# Patient Record
Sex: Female | Born: 1935 | Race: White | Hispanic: No | Marital: Single | Smoking: Never smoker
Health system: Southern US, Community
[De-identification: ages and names within clinical notes are randomized; demographics above are authoritative.]

## PROBLEM LIST (undated history)

## (undated) DIAGNOSIS — T8859XA Other complications of anesthesia, initial encounter: Secondary | ICD-10-CM

## (undated) DIAGNOSIS — C4491 Basal cell carcinoma of skin, unspecified: Secondary | ICD-10-CM

## (undated) DIAGNOSIS — C801 Malignant (primary) neoplasm, unspecified: Secondary | ICD-10-CM

## (undated) DIAGNOSIS — T4145XA Adverse effect of unspecified anesthetic, initial encounter: Secondary | ICD-10-CM

## (undated) DIAGNOSIS — R112 Nausea with vomiting, unspecified: Secondary | ICD-10-CM

## (undated) DIAGNOSIS — Z9889 Other specified postprocedural states: Secondary | ICD-10-CM

## (undated) DIAGNOSIS — I4891 Unspecified atrial fibrillation: Secondary | ICD-10-CM

## (undated) HISTORY — DX: Unspecified atrial fibrillation: I48.91

## (undated) HISTORY — PX: KNEE SURGERY: SHX244

## (undated) HISTORY — PX: ABDOMINAL HYSTERECTOMY: SHX81

## (undated) HISTORY — DX: Basal cell carcinoma of skin, unspecified: C44.91

---
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
## 1962-12-05 HISTORY — PX: DILATION AND CURETTAGE OF UTERUS: SHX78

## 1980-12-05 DIAGNOSIS — C801 Malignant (primary) neoplasm, unspecified: Secondary | ICD-10-CM

## 1980-12-05 HISTORY — DX: Malignant (primary) neoplasm, unspecified: C80.1

## 1980-12-05 HISTORY — PX: MASTECTOMY: SHX3

## 2012-08-16 ENCOUNTER — Ambulatory Visit: Payer: Self-pay | Admitting: Unknown Physician Specialty

## 2017-10-03 ENCOUNTER — Other Ambulatory Visit: Payer: Self-pay | Admitting: Infectious Diseases

## 2017-10-03 DIAGNOSIS — R1013 Epigastric pain: Secondary | ICD-10-CM

## 2017-10-18 ENCOUNTER — Ambulatory Visit
Admission: RE | Admit: 2017-10-18 | Discharge: 2017-10-18 | Disposition: A | Discharge status: Home / Self Care | Payer: Medicare Other | Source: Ambulatory Visit | Attending: Infectious Diseases | Admitting: Infectious Diseases

## 2017-10-18 DIAGNOSIS — I700 Atherosclerosis of aorta: Secondary | ICD-10-CM | POA: Insufficient documentation

## 2017-10-18 DIAGNOSIS — R1013 Epigastric pain: Secondary | ICD-10-CM | POA: Diagnosis present

## 2017-10-18 DIAGNOSIS — N130 Hydronephrosis with ureteropelvic junction obstruction: Secondary | ICD-10-CM | POA: Diagnosis not present

## 2017-10-18 DIAGNOSIS — K7689 Other specified diseases of liver: Secondary | ICD-10-CM | POA: Insufficient documentation

## 2017-10-18 HISTORY — DX: Malignant (primary) neoplasm, unspecified: C80.1

## 2017-10-18 MED ORDER — IOPAMIDOL (ISOVUE-300) INJECTION 61%
100.0000 mL | Freq: Once | INTRAVENOUS | Status: AC | PRN
  Administered 2017-10-18: 100 mL via INTRAVENOUS

## 2017-10-20 ENCOUNTER — Encounter: Payer: Self-pay | Admitting: Urology

## 2017-10-20 ENCOUNTER — Ambulatory Visit (INDEPENDENT_AMBULATORY_CARE_PROVIDER_SITE_OTHER): Payer: Medicare Other | Admitting: Urology

## 2017-10-20 VITALS — BP 121/80 | HR 83 | Ht 62.0 in | Wt 120.9 lb

## 2017-10-20 DIAGNOSIS — N1330 Unspecified hydronephrosis: Secondary | ICD-10-CM | POA: Diagnosis not present

## 2017-10-20 DIAGNOSIS — N2889 Other specified disorders of kidney and ureter: Secondary | ICD-10-CM

## 2017-10-20 NOTE — Progress Notes (Signed)
10/20/2017 12:29 PM   Aretta Nip [birth year] [id number]  Referring provider: Leonel Ramsay, MD [place], [place] [address]  Chief Complaint  Patient presents with  . Hydronephrosis    HPI: The patient is a 83 year old female who presents today for evaluation of bilateral hydronephrosis to the proximal ureter as well as circumferential wall thickening and hyperenhancement of the proximal left ureter.  This was found on a CT scan performed for epigastric pain.  Her only complaint is nocturia x1.  She has no other urologic complaints.  No history of gross hematuria.  No history of recurrent UTI.  No history of nephrolithiasis.  No known history of hydronephrosis.    PMH: Past Medical History:  Diagnosis Date  . Atrial fibrillation ([place])   . Cancer [facility]) 1982   Breast    Surgical History: Past Surgical History:  Procedure Laterality Date  . ABDOMINAL HYSTERECTOMY    . MASTECTOMY      Home Medications:  Allergies as of 10/20/2017      Reactions   Nsaids    Asa [aspirin]    Sulfa Antibiotics Rash      Medication List        Accurate as of 10/20/17 12:29 PM. Always use your most recent med list.          Calcium Carbonate-Vitamin D 600-400 MG-UNIT tablet Take by mouth.   multivitamin tablet Take 1 tablet daily by mouth.   omeprazole 40 MG capsule Commonly known as:  PRILOSEC Take by mouth.   pyridOXINE 25 MG tablet Commonly known as:  VITAMIN B-6 Take by mouth.       Allergies:  Allergies  Allergen Reactions  . Nsaids   . Asa [Aspirin]   . Sulfa Antibiotics Rash    Family History: Family History  Problem Relation Age of Onset  . Bladder Cancer Neg Hx   . Kidney cancer Neg Hx     Social History:  reports that  has never smoked. she has never used smokeless tobacco. She reports that she does not drink alcohol or use drugs.  ROS: UROLOGY Frequent Urination?: No Hard to postpone urination?: No Burning/pain with  urination?: No Get up at night to urinate?: Yes Leakage of urine?: No Urine stream starts and stops?: No Trouble starting stream?: No Do you have to strain to urinate?: No Blood in urine?: No Urinary tract infection?: No Sexually transmitted disease?: No Injury to kidneys or bladder?: No Painful intercourse?: No Weak stream?: No Currently pregnant?: No Vaginal bleeding?: No Last menstrual period?: n  Gastrointestinal Nausea?: No Vomiting?: No Indigestion/heartburn?: No Diarrhea?: No Constipation?: No  Constitutional Fever: No Night sweats?: No Weight loss?: No Fatigue?: No  Skin Skin rash/lesions?: No Itching?: No  Eyes Blurred vision?: No Double vision?: No  Ears/Nose/Throat Sore throat?: No Sinus problems?: No  Hematologic/Lymphatic Swollen glands?: No Easy bruising?: No  Cardiovascular Leg swelling?: No Chest pain?: No  Respiratory Cough?: No Shortness of breath?: No  Endocrine Excessive thirst?: No  Musculoskeletal Back pain?: No Joint pain?: No  Neurological Headaches?: No Dizziness?: No  Psychologic Depression?: No Anxiety?: No  Physical Exam: BP 121/80 (BP Location: Right Arm, Patient Position: Sitting, Cuff Size: Normal)   Pulse 83   Ht 5\' 2"  (1.575 m)   Wt 120 lb 14.4 oz (54.8 kg)   BMI 22.11 kg/m   Constitutional:  Alert and oriented, No acute distress. HEENT: [place] AT, moist mucus membranes.  Trachea midline, no masses. Cardiovascular: No  clubbing, cyanosis, or edema. Respiratory: Normal respiratory effort, no increased work of breathing. GI: Abdomen is soft, nontender, nondistended, no abdominal masses GU: No CVA tenderness.  Skin: No rashes, bruises or suspicious lesions. Lymph: No cervical or inguinal adenopathy. Neurologic: Grossly intact, no focal deficits, moving all 4 extremities. Psychiatric: Normal mood and affect.  Laboratory Data: No results found for: WBC, HGB, HCT, MCV, PLT  No results found for:  CREATININE  No results found for: PSA  No results found for: TESTOSTERONE  No results found for: HGBA1C  Urinalysis No results found for: COLORURINE, APPEARANCEUR, LABSPEC, PHURINE, GLUCOSEU, HGBUR, BILIRUBINUR, KETONESUR, PROTEINUR, UROBILINOGEN, NITRITE, LEUKOCYTESUR  Pertinent Imaging: CLINICAL DATA:  Upper abdominal pain for 1 month.  EXAM: CT ABDOMEN AND PELVIS WITH CONTRAST  TECHNIQUE: Multidetector CT imaging of the abdomen and pelvis was performed using the standard protocol following bolus administration of intravenous contrast.  CONTRAST:  194mL ISOVUE-300 IOPAMIDOL (ISOVUE-300) INJECTION 61%  COMPARISON:  None.  FINDINGS: Lower chest:  Right middle lobe atelectasis.  Hepatobiliary: 6 mm hypoattenuating lesion posterior right liver is too small to characterize but likely benign and probably a cyst. There is no evidence for gallstones, gallbladder wall thickening, or pericholecystic fluid. No intrahepatic or extrahepatic biliary dilation.  Pancreas: No focal mass lesion. No dilatation of the main duct. No intraparenchymal cyst. No peripancreatic edema.  Spleen: No splenomegaly. No focal mass lesion.  Adrenals/Urinary Tract: No adrenal nodule or mass.  There is mild to moderate bilateral hydronephrosis. On the left, this is associated with circumferential wall enhancement in the ureter just proximal to an abrupt transition in the proximal ureter, just distal to the UPJ (image 38 series 2 and coronal image 34 series 5).  Similar degree of hydronephrosis noted in the right kidney with dilation of the right ureter to location at or just below the UPJ. No ureteral wall thickening or hyper enhancement is identified on the right.  Distal ureters are decompressed.  Bladder is unremarkable.  Stomach/Bowel: Wall thickening at the esophagogastric junction may be related to a hiatal hernia. Stomach otherwise unremarkable. Duodenum is normally  positioned as is the ligament of Treitz. No small bowel wall thickening. No small bowel dilatation. The terminal ileum is normal. The appendix is not visualized, but there is no edema or inflammation in the region of the cecum. No gross colonic mass. No colonic wall thickening. No substantial diverticular change.  Vascular/Lymphatic: There is abdominal aortic atherosclerosis without aneurysm. There is no gastrohepatic or hepatoduodenal ligament lymphadenopathy. No intraperitoneal or retroperitoneal lymphadenopathy. No pelvic sidewall lymphadenopathy.  Reproductive: Uterus surgically absent.  There is no adnexal mass.  Other: No intraperitoneal free fluid.  Musculoskeletal: Bone windows reveal no worrisome lytic or sclerotic osseous lesions.  IMPRESSION: 1. Bilateral mild to moderate hydronephrosis with obstruction on each side at the proximal ureter, just distal to the UPJ. On the left there is some mild circumferential wall thickening and hyperenhancement in the ureter just proximal to the abrupt transition. No gross obstructing mass lesion is seen on either side. There is no lymphadenopathy. No retroperitoneal fibrosis. Given changes in the proximal left ureter, urological consultation suggested. 2. 6 mm hypoattenuating right liver lesion, too small to characterize but likely benign. 3.  Aortic Atherosclerois (ICD10-170.0) These results will be called to the ordering clinician or representative by the Radiologist Assistant, and communication documented in the PACS or zVision Dashboard.   Assessment & Plan:    1.  Circumferential thickening with hyperenhancement of the proximal left ureter 2.  Bilateral hydronephrosis to the level of proximal ureter I discussed with the patient that she has an area of concern in her left proximal ureter that would benefit from endoscopic evaluation via ureteroscopy.  I also discussed that she has bilateral hydronephrosis to the proximal  ureter bilaterally.  It is unclear the etiology of this.  I think she would benefit as well from bilateral retrograde pyelogram with possible right ureteroscopy.  I recommended to the patient undergo cystoscopy, bilateral retrograde pyelogram, left ureteroscopy, possible right ureteroscopy, possible ureteral biopsy, possible laser ablation of tumor, and possible bilateral ureteral stent placement.  We discussed the risk, benefits, and indications of this procedure.  She understands the risks include bleeding, infection, iatrogenic injury, need for repeat procedures, and need for ureteral stents.  All questions were answered.  She however would like a second opinion before deciding to undergo surgery.  She is requesting to see my partner, Dr. Erlene Quan, for this.  We will arrange for her to see Dr. Erlene Quan in the near future.   Nickie Retort, MD  [facility] [address], [place], [place] [address] [phone]

## 2017-10-22 LAB — URINE CULTURE

## 2017-10-24 ENCOUNTER — Telehealth: Payer: Self-pay | Admitting: Radiology

## 2017-10-24 ENCOUNTER — Other Ambulatory Visit: Payer: Self-pay | Admitting: Radiology

## 2017-10-24 ENCOUNTER — Ambulatory Visit (INDEPENDENT_AMBULATORY_CARE_PROVIDER_SITE_OTHER): Payer: Medicare Other | Admitting: Urology

## 2017-10-24 ENCOUNTER — Encounter: Payer: Self-pay | Admitting: Urology

## 2017-10-24 VITALS — BP 127/73 | HR 101 | Ht 62.0 in | Wt 120.0 lb

## 2017-10-24 DIAGNOSIS — N2889 Other specified disorders of kidney and ureter: Secondary | ICD-10-CM

## 2017-10-24 DIAGNOSIS — N1330 Unspecified hydronephrosis: Secondary | ICD-10-CM

## 2017-10-24 NOTE — H&P (View-Only) (Signed)
10/24/2017 2:17 PM   Tracy Grimes 12-29-1931 [id number]  Referring provider: Leonel Ramsay, MD [place], [place] [address]  Chief Complaint  Patient presents with  . Hydronephrosis    second opinion    HPI: 83 year old female referred for further evaluation of incidental bilateral hydronephrosis.  CT scan of the abdomen pelvis was obtained as part of workup for an episode of mid epigastric pain, weakness and fatigue.  CT scan abdomen and pelvis with contrast on 10/18/2017 showed mild to moderate hydronephrosis bilaterally with a distended proximal ureter with an abrupt transition point beyond the level of the UPJ of unclear etiology.  Additionally, there is hyperenhancement of the left proximal ureter.  No other GU pathology was identified.  Cr 0.6 on 10/04/27.  She denies any flank pain or history of renal pathology.  No urinary symptoms other than occasional urinary frequency and urgency which has been worsening over the past several years.  No incontinence.  No urinary tract infections or gross hematuria.  Overall, she is quite healthy.  She is never had any previous cross-sectional imaging.  She was previously seen and evaluated in our office last week by Dr. Pilar Jarvis.  She was recommended to undergo further evaluation with bilateral retrograde, diagnostic ureteroscopy.   PMH: Past Medical History:  Diagnosis Date  . Atrial fibrillation ([place])   . Cancer [facility]) 1982   Breast    Surgical History: Past Surgical History:  Procedure Laterality Date  . ABDOMINAL HYSTERECTOMY    . MASTECTOMY      Home Medications:  Allergies as of 10/24/2017      Reactions   Asa [aspirin] Anaphylaxis   Nsaids Anaphylaxis   Sulfa Antibiotics Rash      Medication List        Accurate as of 10/24/17 11:59 PM. Always use your most recent med list.          acetaminophen 500 MG tablet Commonly known as:  TYLENOL Take 500 mg by mouth every 6 (six) hours  as needed for moderate pain or headache.   Biotin 1000 MCG tablet Take 1,000 mcg by mouth daily.   Calcium Carbonate-Vitamin D 600-400 MG-UNIT tablet Take 2 tablets by mouth daily.   multivitamin tablet Take 1 tablet daily by mouth.   omeprazole 40 MG capsule Commonly known as:  PRILOSEC Take 40 mg by mouth daily.   PRESERVISION AREDS 2 PO Take 1 capsule by mouth 2 (two) times daily.   pyridOXINE 100 MG tablet Commonly known as:  VITAMIN B-6 Take 100 mg by mouth daily.       Allergies:  Allergies  Allergen Reactions  . Asa [Aspirin] Anaphylaxis  . Nsaids Anaphylaxis  . Sulfa Antibiotics Rash    Family History: Family History  Problem Relation Age of Onset  . Bladder Cancer Neg Hx   . Kidney cancer Neg Hx     Social History:  reports that  has never smoked. she has never used smokeless tobacco. She reports that she does not drink alcohol or use drugs.  ROS: UROLOGY Frequent Urination?: No Hard to postpone urination?: No Burning/pain with urination?: No Get up at night to urinate?: No Leakage of urine?: No Urine stream starts and stops?: No Trouble starting stream?: No Do you have to strain to urinate?: No Blood in urine?: No Urinary tract infection?: No Sexually transmitted disease?: No Injury to kidneys or bladder?: No Painful intercourse?: No Weak stream?: No Currently pregnant?: No Vaginal bleeding?: No Last  menstrual period?: n  Gastrointestinal Nausea?: No Vomiting?: No Indigestion/heartburn?: No Diarrhea?: No Constipation?: No  Constitutional Fever: No Night sweats?: No Weight loss?: No Fatigue?: Yes  Skin Skin rash/lesions?: No Itching?: No  Eyes Blurred vision?: No Double vision?: No  Ears/Nose/Throat Sore throat?: No Sinus problems?: No  Hematologic/Lymphatic Swollen glands?: No Easy bruising?: No  Cardiovascular Leg swelling?: No Chest pain?: No  Respiratory Cough?: No Shortness of breath?:  No  Endocrine Excessive thirst?: No  Musculoskeletal Back pain?: No Joint pain?: No  Neurological Headaches?: No Dizziness?: No  Psychologic Depression?: No Anxiety?: No  Physical Exam: BP 127/73   Pulse (!) 101   Ht 5\' 2"  (1.575 m)   Wt 120 lb (54.4 kg)   BMI 21.95 kg/m   Constitutional:  Alert and oriented, No acute distress. HEENT: [place] AT, moist mucus membranes.  Trachea midline, no masses. Cardiovascular: No clubbing, cyanosis, or edema. Respiratory: Normal respiratory effort, no increased work of breathing. GI: Abdomen is soft, nontender, nondistended, no abdominal masses GU: No CVA tenderness.  Skin: No rashes, bruises or suspicious lesions. Lymph: No cervical or inguinal adenopathy. Neurologic: Grossly intact, no focal deficits, moving all 4 extremities. Psychiatric: Normal mood and affect.  Laboratory Data: Cr 0.6 on 09/3017.    Urinalysis UA from 10/19/2017 reviewed, negative for blood or infection.  Pertinent Imaging: CLINICAL DATA:  Upper abdominal pain for 1 month.  EXAM: CT ABDOMEN AND PELVIS WITH CONTRAST  TECHNIQUE: Multidetector CT imaging of the abdomen and pelvis was performed using the standard protocol following bolus administration of intravenous contrast.  CONTRAST:  182mL ISOVUE-300 IOPAMIDOL (ISOVUE-300) INJECTION 61%  COMPARISON:  None.  FINDINGS: Lower chest:  Right middle lobe atelectasis.  Hepatobiliary: 6 mm hypoattenuating lesion posterior right liver is too small to characterize but likely benign and probably a cyst. There is no evidence for gallstones, gallbladder wall thickening, or pericholecystic fluid. No intrahepatic or extrahepatic biliary dilation.  Pancreas: No focal mass lesion. No dilatation of the main duct. No intraparenchymal cyst. No peripancreatic edema.  Spleen: No splenomegaly. No focal mass lesion.  Adrenals/Urinary Tract: No adrenal nodule or mass.  There is mild to moderate bilateral  hydronephrosis. On the left, this is associated with circumferential wall enhancement in the ureter just proximal to an abrupt transition in the proximal ureter, just distal to the UPJ (image 38 series 2 and coronal image 34 series 5).  Similar degree of hydronephrosis noted in the right kidney with dilation of the right ureter to location at or just below the UPJ. No ureteral wall thickening or hyper enhancement is identified on the right.  Distal ureters are decompressed.  Bladder is unremarkable.  Stomach/Bowel: Wall thickening at the esophagogastric junction may be related to a hiatal hernia. Stomach otherwise unremarkable. Duodenum is normally positioned as is the ligament of Treitz. No small bowel wall thickening. No small bowel dilatation. The terminal ileum is normal. The appendix is not visualized, but there is no edema or inflammation in the region of the cecum. No gross colonic mass. No colonic wall thickening. No substantial diverticular change.  Vascular/Lymphatic: There is abdominal aortic atherosclerosis without aneurysm. There is no gastrohepatic or hepatoduodenal ligament lymphadenopathy. No intraperitoneal or retroperitoneal lymphadenopathy. No pelvic sidewall lymphadenopathy.  Reproductive: Uterus surgically absent.  There is no adnexal mass.  Other: No intraperitoneal free fluid.  Musculoskeletal: Bone windows reveal no worrisome lytic or sclerotic osseous lesions.  IMPRESSION: 1. Bilateral mild to moderate hydronephrosis with obstruction on each side at the proximal ureter,  just distal to the UPJ. On the left there is some mild circumferential wall thickening and hyperenhancement in the ureter just proximal to the abrupt transition. No gross obstructing mass lesion is seen on either side. There is no lymphadenopathy. No retroperitoneal fibrosis. Given changes in the proximal left ureter, urological consultation suggested. 2. 6 mm hypoattenuating  right liver lesion, too small to characterize but likely benign. 3.  Aortic Atherosclerois (ICD10-170.0) These results will be called to the ordering clinician or representative by the Radiologist Assistant, and communication documented in the PACS or zVision Dashboard.   Electronically Signed   By: Misty Stanley M.D.   On: 10/18/2017 12:46  CT scan was personally reviewed today and with the patient.  Assessment & Plan:    1. Hydronephrosis, unspecified hydronephrosis type CT scan imaging reviewed-incidental bilateral hydroureteronephrosis with abrupt transition bilaterally which does not appear to be consistent with UPJ Given bilateral nature, may have congenital component vs. Infectious/inflammatory vs. Least likely malignancy  Creatinine remains at baseline No previous imaging for comparison I  Have strongly recommended further evaluation in the form of bilateral retrograde pyelogram, possible diagnostic ureteroscopy, possible biopsy, possible stent in order to rule out any pathology   Risk and benefits were discussed in detail.  All questions were answered.  She is agreeable with this plan and will be booked for surgery in early December.  2. Ureteritis Hyperenhancement of left proximal ureter, question infectious/ inflammatory component Otherwise asymptomatic without flank pain, or UTI  [name], MD  [place] [facility] [address], [place], [place] [address] [phone]   I spent 25 min with this patient of which greater than 50% was spent in counseling and coordination of care with the patient.

## 2017-10-24 NOTE — Progress Notes (Signed)
10/24/2017 2:17 PM   Tracy Grimes Apr 27, 1932 [id number]  Referring provider: Leonel Ramsay, MD [place], [place] [address]  Chief Complaint  Patient presents with  . Hydronephrosis    second opinion    HPI: 83 year old female referred for further evaluation of incidental bilateral hydronephrosis.  CT scan of the abdomen pelvis was obtained as part of workup for an episode of mid epigastric pain, weakness and fatigue.  CT scan abdomen and pelvis with contrast on 10/18/2017 showed mild to moderate hydronephrosis bilaterally with a distended proximal ureter with an abrupt transition point beyond the level of the UPJ of unclear etiology.  Additionally, there is hyperenhancement of the left proximal ureter.  No other GU pathology was identified.  Cr 0.6 on 10/04/27.  She denies any flank pain or history of renal pathology.  No urinary symptoms other than occasional urinary frequency and urgency which has been worsening over the past several years.  No incontinence.  No urinary tract infections or gross hematuria.  Overall, she is quite healthy.  She is never had any previous cross-sectional imaging.  She was previously seen and evaluated in our office last week by Dr. Pilar Jarvis.  She was recommended to undergo further evaluation with bilateral retrograde, diagnostic ureteroscopy.   PMH: Past Medical History:  Diagnosis Date  . Atrial fibrillation ([place])   . Cancer [facility]) 1982   Breast    Surgical History: Past Surgical History:  Procedure Laterality Date  . ABDOMINAL HYSTERECTOMY    . MASTECTOMY      Home Medications:  Allergies as of 10/24/2017      Reactions   Asa [aspirin] Anaphylaxis   Nsaids Anaphylaxis   Sulfa Antibiotics Rash      Medication List        Accurate as of 10/24/17 11:59 PM. Always use your most recent med list.          acetaminophen 500 MG tablet Commonly known as:  TYLENOL Take 500 mg by mouth every 6 (six) hours  as needed for moderate pain or headache.   Biotin 1000 MCG tablet Take 1,000 mcg by mouth daily.   Calcium Carbonate-Vitamin D 600-400 MG-UNIT tablet Take 2 tablets by mouth daily.   multivitamin tablet Take 1 tablet daily by mouth.   omeprazole 40 MG capsule Commonly known as:  PRILOSEC Take 40 mg by mouth daily.   PRESERVISION AREDS 2 PO Take 1 capsule by mouth 2 (two) times daily.   pyridOXINE 100 MG tablet Commonly known as:  VITAMIN B-6 Take 100 mg by mouth daily.       Allergies:  Allergies  Allergen Reactions  . Asa [Aspirin] Anaphylaxis  . Nsaids Anaphylaxis  . Sulfa Antibiotics Rash    Family History: Family History  Problem Relation Age of Onset  . Bladder Cancer Neg Hx   . Kidney cancer Neg Hx     Social History:  reports that  has never smoked. she has never used smokeless tobacco. She reports that she does not drink alcohol or use drugs.  ROS: UROLOGY Frequent Urination?: No Hard to postpone urination?: No Burning/pain with urination?: No Get up at night to urinate?: No Leakage of urine?: No Urine stream starts and stops?: No Trouble starting stream?: No Do you have to strain to urinate?: No Blood in urine?: No Urinary tract infection?: No Sexually transmitted disease?: No Injury to kidneys or bladder?: No Painful intercourse?: No Weak stream?: No Currently pregnant?: No Vaginal bleeding?: No Last  menstrual period?: n  Gastrointestinal Nausea?: No Vomiting?: No Indigestion/heartburn?: No Diarrhea?: No Constipation?: No  Constitutional Fever: No Night sweats?: No Weight loss?: No Fatigue?: Yes  Skin Skin rash/lesions?: No Itching?: No  Eyes Blurred vision?: No Double vision?: No  Ears/Nose/Throat Sore throat?: No Sinus problems?: No  Hematologic/Lymphatic Swollen glands?: No Easy bruising?: No  Cardiovascular Leg swelling?: No Chest pain?: No  Respiratory Cough?: No Shortness of breath?:  No  Endocrine Excessive thirst?: No  Musculoskeletal Back pain?: No Joint pain?: No  Neurological Headaches?: No Dizziness?: No  Psychologic Depression?: No Anxiety?: No  Physical Exam: BP 127/73   Pulse (!) 101   Ht 5\' 2"  (1.575 m)   Wt 120 lb (54.4 kg)   BMI 21.95 kg/m   Constitutional:  Alert and oriented, No acute distress. HEENT: [place] AT, moist mucus membranes.  Trachea midline, no masses. Cardiovascular: No clubbing, cyanosis, or edema. Respiratory: Normal respiratory effort, no increased work of breathing. GI: Abdomen is soft, nontender, nondistended, no abdominal masses GU: No CVA tenderness.  Skin: No rashes, bruises or suspicious lesions. Lymph: No cervical or inguinal adenopathy. Neurologic: Grossly intact, no focal deficits, moving all 4 extremities. Psychiatric: Normal mood and affect.  Laboratory Data: Cr 0.6 on 09/3017.    Urinalysis UA from 10/19/2017 reviewed, negative for blood or infection.  Pertinent Imaging: CLINICAL DATA:  Upper abdominal pain for 1 month.  EXAM: CT ABDOMEN AND PELVIS WITH CONTRAST  TECHNIQUE: Multidetector CT imaging of the abdomen and pelvis was performed using the standard protocol following bolus administration of intravenous contrast.  CONTRAST:  143mL ISOVUE-300 IOPAMIDOL (ISOVUE-300) INJECTION 61%  COMPARISON:  None.  FINDINGS: Lower chest:  Right middle lobe atelectasis.  Hepatobiliary: 6 mm hypoattenuating lesion posterior right liver is too small to characterize but likely benign and probably a cyst. There is no evidence for gallstones, gallbladder wall thickening, or pericholecystic fluid. No intrahepatic or extrahepatic biliary dilation.  Pancreas: No focal mass lesion. No dilatation of the main duct. No intraparenchymal cyst. No peripancreatic edema.  Spleen: No splenomegaly. No focal mass lesion.  Adrenals/Urinary Tract: No adrenal nodule or mass.  There is mild to moderate bilateral  hydronephrosis. On the left, this is associated with circumferential wall enhancement in the ureter just proximal to an abrupt transition in the proximal ureter, just distal to the UPJ (image 38 series 2 and coronal image 34 series 5).  Similar degree of hydronephrosis noted in the right kidney with dilation of the right ureter to location at or just below the UPJ. No ureteral wall thickening or hyper enhancement is identified on the right.  Distal ureters are decompressed.  Bladder is unremarkable.  Stomach/Bowel: Wall thickening at the esophagogastric junction may be related to a hiatal hernia. Stomach otherwise unremarkable. Duodenum is normally positioned as is the ligament of Treitz. No small bowel wall thickening. No small bowel dilatation. The terminal ileum is normal. The appendix is not visualized, but there is no edema or inflammation in the region of the cecum. No gross colonic mass. No colonic wall thickening. No substantial diverticular change.  Vascular/Lymphatic: There is abdominal aortic atherosclerosis without aneurysm. There is no gastrohepatic or hepatoduodenal ligament lymphadenopathy. No intraperitoneal or retroperitoneal lymphadenopathy. No pelvic sidewall lymphadenopathy.  Reproductive: Uterus surgically absent.  There is no adnexal mass.  Other: No intraperitoneal free fluid.  Musculoskeletal: Bone windows reveal no worrisome lytic or sclerotic osseous lesions.  IMPRESSION: 1. Bilateral mild to moderate hydronephrosis with obstruction on each side at the proximal ureter,  just distal to the UPJ. On the left there is some mild circumferential wall thickening and hyperenhancement in the ureter just proximal to the abrupt transition. No gross obstructing mass lesion is seen on either side. There is no lymphadenopathy. No retroperitoneal fibrosis. Given changes in the proximal left ureter, urological consultation suggested. 2. 6 mm hypoattenuating  right liver lesion, too small to characterize but likely benign. 3.  Aortic Atherosclerois (ICD10-170.0) These results will be called to the ordering clinician or representative by the Radiologist Assistant, and communication documented in the PACS or zVision Dashboard.   Electronically Signed   By: Misty Stanley M.D.   On: 10/18/2017 12:46  CT scan was personally reviewed today and with the patient.  Assessment & Plan:    1. Hydronephrosis, unspecified hydronephrosis type CT scan imaging reviewed-incidental bilateral hydroureteronephrosis with abrupt transition bilaterally which does not appear to be consistent with UPJ Given bilateral nature, may have congenital component vs. Infectious/inflammatory vs. Least likely malignancy  Creatinine remains at baseline No previous imaging for comparison I  Have strongly recommended further evaluation in the form of bilateral retrograde pyelogram, possible diagnostic ureteroscopy, possible biopsy, possible stent in order to rule out any pathology   Risk and benefits were discussed in detail.  All questions were answered.  She is agreeable with this plan and will be booked for surgery in early December.  2. Ureteritis Hyperenhancement of left proximal ureter, question infectious/ inflammatory component Otherwise asymptomatic without flank pain, or UTI  [name], MD  [facility] [address], [place], [place] [address] [phone]   I spent 25 min with this patient of which greater than 50% was spent in counseling and coordination of care with the patient.

## 2017-10-24 NOTE — Telephone Encounter (Signed)
Made pt aware of surgery scheduled 11/08/17 with Dr Erlene Quan & pre-admit testing appt on 11/02/17 @8 :00. Instructions given. Questions answered. Pt voices understanding & appreciation of call.

## 2017-10-25 ENCOUNTER — Other Ambulatory Visit: Payer: Self-pay | Admitting: Radiology

## 2017-11-02 ENCOUNTER — Other Ambulatory Visit: Payer: Self-pay

## 2017-11-02 ENCOUNTER — Encounter
Admission: RE | Admit: 2017-11-02 | Discharge: 2017-11-02 | Disposition: A | Discharge status: Home / Self Care | Payer: Medicare Other | Source: Ambulatory Visit | Attending: Urology | Admitting: Urology

## 2017-11-02 HISTORY — DX: Other complications of anesthesia, initial encounter: T88.59XA

## 2017-11-02 HISTORY — DX: Adverse effect of unspecified anesthetic, initial encounter: T41.45XA

## 2017-11-02 HISTORY — DX: Other specified postprocedural states: Z98.890

## 2017-11-02 HISTORY — DX: Nausea with vomiting, unspecified: R11.2

## 2017-11-02 NOTE — Patient Instructions (Signed)
  Your procedure is scheduled on:Wednesday Dec. 5, 2018. Report to Same Day Surgery. To find out your arrival time please call [phone] between 1PM - 3PM on Tuesday Dec. 4, 2018.  Remember: Instructions that are not followed completely may result in serious medical risk, up to and including death, or upon the discretion of your surgeon and anesthesiologist your surgery may need to be rescheduled.    _x___ 1. Do not eat food after midnight night prior to surgery. No gum   chewing or hard candies, snacks or breakfast.    2023-05-20 drink the following: water, Gatorade, clear apple juice, black coffee     or black tea up until 2 hours prior to ARRIVAL time.     ____ 2. No Alcohol for 24 hours before or after surgery.   ____ 3. Bring all medications with you on the day of surgery if instructed.    __x__ 4. Notify your doctor if there is any change in your medical condition     (cold, fever, infections).    _____ 5.   Do Not Smoke or use e-cigarettes For 24 Hours Prior to Your   Surgery.  Do not use any chewable tobacco products for at least 6   hours prior to  surgery.                      Do not wear jewelry, make-up, hairpins, clips or nail polish.  Do not wear lotions, powders, or perfumes.   Do not shave 48 hours prior to surgery. Men may shave face and neck.  Do not bring valuables to the hospital.    [facility] is not responsible for any belongings or valuables.               Contacts, dentures or bridgework may not be worn into surgery.  Leave your suitcase in the car. After surgery it may be brought to your room.  For patients admitted to the hospital, discharge time is determined by your  treatment team.   Patients discharged the day of surgery will not be allowed to drive home.    Please read over the following fact sheets that you were given:   [facility] Preparing for Surgery  __x__ Take these medicines the morning of surgery with A SIP OF WATER:    1. omeprazole  (PRILOSEC) take at bedtime the night prior to surgery and the morning of surgery.       ____ Fleet Enema (as directed)   ____ Use CHG Soap as directed on instruction sheet  ____ Use inhalers on the day of surgery and bring to hospital day of surgery  ____ Stop metformin 2 days prior to surgery    ____ Take 1/2 of usual insulin dose the night before surgery and none on the morning of          surgery.   ____ Stop Eliquis/Coumadin/Plavix/aspirin on does not apply  ____ Stop Anti-inflammatories such as Advil, Aleve, Ibuprofen, Motrin, Naproxen,  Naprosyn, Goodies powders or aspirin products. OK to take Tylenol.   _x___ Stop supplements:Biotin   until after surgery.    _x___ Bring your Living Will and [place] to scan into Epic system.

## 2017-11-02 NOTE — Pre-Procedure Instructions (Signed)
Spoke with Dr. Ronelle Nigh regarding CBC WNL 820/2018, Met B WNL 10/03/17 and medical history of A-Fib and breast cancer.  No need to repeat CBC at this time, use CBC from 07/24/2017.

## 2017-11-02 NOTE — Pre-Procedure Instructions (Addendum)
Cardiac clearance from Dr. Saralyn Pilar in front of pt's chart, low risk.  ECG 12-lead8/20/2018 [place] Component Name Value Ref Range  Vent Rate (bpm) 84   PR Interval (msec) 188   QRS Interval (msec) 88   QT Interval (msec) 366   QTc (msec) 432   Other Result Information  This result has an attachment that is not available.  Result Narrative  Normal sinus rhythm Possible Left atrial enlargement Borderline ECG When compared with ECG of 20-Jan-2016 10:58, premature ventricular complexes are no longer present I reviewed and concur with this report. Electronically signed FG:[id number] MD, DAVID ([id number]) on 08/04/2017 12:00:50 PM

## 2017-11-07 MED ORDER — CEFAZOLIN SODIUM-DEXTROSE 2-4 GM/100ML-% IV SOLN
2.0000 g | INTRAVENOUS | Status: AC
  Administered 2017-11-08: 2 g via INTRAVENOUS

## 2017-11-08 ENCOUNTER — Ambulatory Visit: Payer: Medicare Other | Admitting: Certified Registered Nurse Anesthetist

## 2017-11-08 ENCOUNTER — Encounter
Admission: RE | Disposition: A | Discharge status: Home / Self Care | Payer: Self-pay | Source: Ambulatory Visit | Attending: Urology

## 2017-11-08 ENCOUNTER — Encounter: Payer: Self-pay | Admitting: Certified Registered Nurse Anesthetist

## 2017-11-08 ENCOUNTER — Ambulatory Visit
Admission: RE | Admit: 2017-11-08 | Discharge: 2017-11-08 | Disposition: A | Discharge status: Home / Self Care | Payer: Medicare Other | Source: Ambulatory Visit | Attending: Urology | Admitting: Urology

## 2017-11-08 DIAGNOSIS — N2889 Other specified disorders of kidney and ureter: Secondary | ICD-10-CM | POA: Diagnosis not present

## 2017-11-08 DIAGNOSIS — K219 Gastro-esophageal reflux disease without esophagitis: Secondary | ICD-10-CM | POA: Insufficient documentation

## 2017-11-08 DIAGNOSIS — N1330 Unspecified hydronephrosis: Secondary | ICD-10-CM | POA: Insufficient documentation

## 2017-11-08 DIAGNOSIS — Z9010 Acquired absence of unspecified breast and nipple: Secondary | ICD-10-CM | POA: Diagnosis not present

## 2017-11-08 DIAGNOSIS — I700 Atherosclerosis of aorta: Secondary | ICD-10-CM | POA: Insufficient documentation

## 2017-11-08 DIAGNOSIS — Z79899 Other long term (current) drug therapy: Secondary | ICD-10-CM | POA: Insufficient documentation

## 2017-11-08 DIAGNOSIS — N132 Hydronephrosis with renal and ureteral calculous obstruction: Secondary | ICD-10-CM | POA: Diagnosis not present

## 2017-11-08 HISTORY — PX: CYSTOSCOPY WITH URETEROSCOPY: SHX5123

## 2017-11-08 HISTORY — PX: CYSTOSCOPY W/ RETROGRADES: SHX1426

## 2017-11-08 HISTORY — PX: CYSTOSCOPY WITH STENT PLACEMENT: SHX5790

## 2017-11-08 SURGERY — CYSTOSCOPY WITH URETEROSCOPY
Anesthesia: General | Site: Ureter | Laterality: Bilateral | Wound class: Clean

## 2017-11-08 MED ORDER — IOTHALAMATE MEGLUMINE 43 % IV SOLN
INTRAVENOUS | Status: DC | PRN
  Administered 2017-11-08: 10 mL

## 2017-11-08 MED ORDER — SCOPOLAMINE 1 MG/3DAYS TD PT72
1.0000 | MEDICATED_PATCH | Freq: Once | TRANSDERMAL | Status: DC
  Administered 2017-11-08: 1.5 mg via TRANSDERMAL

## 2017-11-08 MED ORDER — ACETAMINOPHEN 10 MG/ML IV SOLN
INTRAVENOUS | Status: AC
  Filled 2017-11-08: qty 100

## 2017-11-08 MED ORDER — LACTATED RINGERS IV SOLN
INTRAVENOUS | Status: DC
  Administered 2017-11-08: 10:00:00 via INTRAVENOUS

## 2017-11-08 MED ORDER — ACETAMINOPHEN 10 MG/ML IV SOLN
INTRAVENOUS | Status: DC | PRN
  Administered 2017-11-08: 1000 mg via INTRAVENOUS

## 2017-11-08 MED ORDER — PROPOFOL 10 MG/ML IV BOLUS
INTRAVENOUS | Status: DC | PRN
  Administered 2017-11-08: 50 mg via INTRAVENOUS
  Administered 2017-11-08: 150 mg via INTRAVENOUS

## 2017-11-08 MED ORDER — LIDOCAINE HCL (PF) 2 % IJ SOLN
INTRAMUSCULAR | Status: AC
  Filled 2017-11-08: qty 10

## 2017-11-08 MED ORDER — SCOPOLAMINE 1 MG/3DAYS TD PT72
MEDICATED_PATCH | TRANSDERMAL | Status: AC
  Administered 2017-11-08: 1.5 mg via TRANSDERMAL
  Filled 2017-11-08: qty 1

## 2017-11-08 MED ORDER — TAMSULOSIN HCL 0.4 MG PO CAPS: 0.4000 mg | ORAL_CAPSULE | Freq: Every day | ORAL | 0 refills | Status: AC

## 2017-11-08 MED ORDER — OXYBUTYNIN CHLORIDE 5 MG PO TABS: 5.0000 mg | ORAL_TABLET | Freq: Three times a day (TID) | ORAL | 0 refills | Status: AC | PRN

## 2017-11-08 MED ORDER — ONDANSETRON HCL 4 MG/2ML IJ SOLN
INTRAMUSCULAR | Status: AC
Start: 2017-11-08 — End: ?
  Filled 2017-11-08: qty 2

## 2017-11-08 MED ORDER — DEXAMETHASONE SODIUM PHOSPHATE 10 MG/ML IJ SOLN
INTRAMUSCULAR | Status: AC
Start: 2017-11-08 — End: ?
  Filled 2017-11-08: qty 1

## 2017-11-08 MED ORDER — PROPOFOL 500 MG/50ML IV EMUL
INTRAVENOUS | Status: AC
  Filled 2017-11-08: qty 50

## 2017-11-08 MED ORDER — KETOROLAC TROMETHAMINE 30 MG/ML IJ SOLN: INTRAMUSCULAR | Status: DC | PRN

## 2017-11-08 MED ORDER — ROCURONIUM BROMIDE 50 MG/5ML IV SOLN
INTRAVENOUS | Status: AC
  Filled 2017-11-08: qty 1

## 2017-11-08 MED ORDER — GLYCOPYRROLATE 0.2 MG/ML IJ SOLN
INTRAMUSCULAR | Status: AC
  Filled 2017-11-08: qty 1

## 2017-11-08 MED ORDER — PROPOFOL 500 MG/50ML IV EMUL
INTRAVENOUS | Status: DC | PRN
  Administered 2017-11-08: 25 ug/kg/min via INTRAVENOUS

## 2017-11-08 MED ORDER — LIDOCAINE HCL (CARDIAC) 20 MG/ML IV SOLN
INTRAVENOUS | Status: DC | PRN
  Administered 2017-11-08: 100 mg via INTRAVENOUS

## 2017-11-08 MED ORDER — ONDANSETRON HCL 4 MG/2ML IJ SOLN: 4.0000 mg | Freq: Once | INTRAMUSCULAR | Status: DC | PRN

## 2017-11-08 MED ORDER — KETOROLAC TROMETHAMINE 30 MG/ML IJ SOLN
INTRAMUSCULAR | Status: AC
  Filled 2017-11-08: qty 1

## 2017-11-08 MED ORDER — FENTANYL CITRATE (PF) 100 MCG/2ML IJ SOLN
INTRAMUSCULAR | Status: AC
  Filled 2017-11-08: qty 2

## 2017-11-08 MED ORDER — OXYBUTYNIN CHLORIDE 5 MG PO TABS
ORAL_TABLET | ORAL | Status: AC
  Filled 2017-11-08: qty 1

## 2017-11-08 MED ORDER — ONDANSETRON HCL 4 MG/2ML IJ SOLN
INTRAMUSCULAR | Status: DC | PRN
  Administered 2017-11-08: 4 mg via INTRAVENOUS

## 2017-11-08 MED ORDER — HYDROCODONE-ACETAMINOPHEN 5-325 MG PO TABS: 1.0000 | ORAL_TABLET | Freq: Four times a day (QID) | ORAL | 0 refills | Status: DC | PRN

## 2017-11-08 MED ORDER — CEFAZOLIN SODIUM-DEXTROSE 2-4 GM/100ML-% IV SOLN
INTRAVENOUS | Status: AC
  Filled 2017-11-08: qty 100

## 2017-11-08 MED ORDER — DEXAMETHASONE SODIUM PHOSPHATE 10 MG/ML IJ SOLN
INTRAMUSCULAR | Status: DC | PRN
  Administered 2017-11-08: 10 mg via INTRAVENOUS

## 2017-11-08 MED ORDER — PROPOFOL 10 MG/ML IV BOLUS
INTRAVENOUS | Status: AC
  Filled 2017-11-08: qty 20

## 2017-11-08 SURGICAL SUPPLY — 30 items
BAG DRAIN CYSTO-URO LG1000N (MISCELLANEOUS) ×4 IMPLANT
BRUSH SCRUB EZ  4% CHG (MISCELLANEOUS) ×2
BRUSH SCRUB EZ 4% CHG (MISCELLANEOUS) ×2 IMPLANT
CATH URETL 5X70 OPEN END (CATHETERS) ×4 IMPLANT
CONRAY 43 FOR UROLOGY 50M (MISCELLANEOUS) ×4 IMPLANT
DRAPE UTILITY 15X26 TOWEL STRL (DRAPES) ×4 IMPLANT
DRSG TELFA 4X3 1S NADH ST (GAUZE/BANDAGES/DRESSINGS) ×4 IMPLANT
ELECT REM PT RETURN 9FT ADLT (ELECTROSURGICAL) ×4
ELECTRODE REM PT RTRN 9FT ADLT (ELECTROSURGICAL) ×2 IMPLANT
GLOVE BIO SURGEON STRL SZ 6.5 (GLOVE) ×3 IMPLANT
GLOVE BIO SURGEONS STRL SZ 6.5 (GLOVE) ×1
GOWN STRL REUS W/ TWL LRG LVL3 (GOWN DISPOSABLE) ×4 IMPLANT
GOWN STRL REUS W/TWL LRG LVL3 (GOWN DISPOSABLE) ×4
GUIDEWIRE GREEN .038 145CM (MISCELLANEOUS) ×4 IMPLANT
KIT RM TURNOVER CYSTO AR (KITS) ×4 IMPLANT
NDL SAFETY ECLIPSE 18X1.5 (NEEDLE) ×2 IMPLANT
NEEDLE HYPO 18GX1.5 SHARP (NEEDLE) ×2
PACK CYSTO AR (MISCELLANEOUS) ×4 IMPLANT
SENSORWIRE 0.038 NOT ANGLED (WIRE) ×4
SET CYSTO W/LG BORE CLAMP LF (SET/KITS/TRAYS/PACK) ×4 IMPLANT
SOL .9 NS 3000ML IRR  AL (IV SOLUTION) ×2
SOL .9 NS 3000ML IRR UROMATIC (IV SOLUTION) ×2 IMPLANT
STENT URET 6FRX22 CONTOUR (STENTS) ×8 IMPLANT
STENT URET 6FRX24 CONTOUR (STENTS) IMPLANT
STENT URET 6FRX26 CONTOUR (STENTS) IMPLANT
SURGILUBE 2OZ TUBE FLIPTOP (MISCELLANEOUS) ×4 IMPLANT
SYRINGE IRR TOOMEY STRL 70CC (SYRINGE) ×4 IMPLANT
WATER STERILE IRR 1000ML POUR (IV SOLUTION) ×4 IMPLANT
WATER STERILE IRR 3000ML UROMA (IV SOLUTION) ×4 IMPLANT
WIRE SENSOR 0.038 NOT ANGLED (WIRE) ×2 IMPLANT

## 2017-11-08 NOTE — Op Note (Signed)
Date of procedure: 11/08/17  Preoperative diagnosis:  1. Bilateral hydronephrosis 2. Left ureteritis  Postoperative diagnosis:  1. Same as above  Procedure: 1. Cystoscopy 2. Bilateral retrograde pyelogram 3. Bilateral diagnostic ureteroscopy 4. Bilateral ureteral stent placement  Surgeon: [name], MD  Anesthesia: General  Complications: None  Intraoperative findings: Discrete focal area of what appears to be extrinsic compression bilaterally, at the left UPJ (suspected UPJ obstruction ) and right proximal ureter (unclear etiology) with proximal hydroureteronephrosis.  Easily able to pass scope beyond this area.  No intraluminal lesions or tumors identified.  EBL: Minimal  Specimens: None  Drains: 6 x 22 French double-J ureteral stent bilaterally  Indication: Tracy Grimes is a 83 y.o. patient with abdominal discomfort who underwent contrast CT abdomen pelvis for further evaluation found to have incidental hydro-nephrosis of unclear etiology as well as possible enhancement of the left proximal ureter.  After reviewing the management options for treatment, she elected to proceed with the above surgical procedure(s). We have discussed the potential benefits and risks of the procedure, side effects of the proposed treatment, the likelihood of the patient achieving the goals of the procedure, and any potential problems that might occur during the procedure or recuperation. Informed consent has been obtained.  Description of procedure:  The patient was taken to the operating room and general anesthesia was induced.  The patient was placed in the dorsal lithotomy position, prepped and draped in the usual sterile fashion, and preoperative antibiotics were administered. A preoperative time-out was performed.   A 21 French scope was advanced per urethra into the bladder.  The bladder was carefully surveyed and noted to be mildly trabeculated, otherwise unremarkable.  The urothelium  was normal without tumors, ulcerations, or lesions.  The trigone was in normal anatomic position with clear reflux of urine from both UOs.  Attention was first turned to the left ureteral orifice was cannulated using a 5 [place] open-ended ureteral catheter.  A gentle retrograde pyelogram was performed on the side which revealed mild left hydronephrosis down to the level of the UPJ with a sharp discrete narrowed area most consistent with UPJ obstruction.  On CT scan, this correlated with the position of a crossing vessel.  A wire was then able to be placed beyond this level without difficulty.  A 7 French flexible ureteroscope was then advanced up to level the renal pelvis and beyond this narrowed area without difficulty.  Each every calyx was directly visualized and noted to be free of any tumors or lesions.  The scope was then backed down the length of the ureter inspecting along the way.  The level of the narrowing was traversed several times to ensure that there were no intraluminal lesions or concerning findings.  This appeared to be relatively benign, easily traversable with the scalp, and represented extrinsic ureteral compression versus a ureteral fold.  Upon removing the scope, the wire was replaced.  A 6 x 28 [place] with a ureteral stent was then advanced over the wire up to the level of the renal pelvis.  The wire is partially drawn until focal stone within the renal pelvis.  The wires and fully withdrawn a focal is noted within the bladder.  Tension was then turned to the right ureteral orifice and the same exact procedure was performed.  On this side, retrograde pyelogram revealed mild yet slightly more significant hydronephrosis on this side with an abrupt transition in the proximal ureter which was below the level of the UPJ.  This area was also discrete and focal.  The remainder of the ureter was decompressed without any filling defects.  The wire was then placed up to the level of the kidney.   The scope was advanced to the renal pelvis as described above.  Each every calyx was then surveyed and noted to be free of any tumors stones or lesions.  The scope was then withdrawn back to the level of the transition where again the ureteral fold with extrinsic compression was appreciated.  The scope was advanced back and forth through this area several times, each time requiring a wire to re-advance the scope beyond the area but it did go easily.  There were no intraluminal lesions or abnormalities appreciated at this level.  The wire was replaced and the scope was removed inspecting the  remaining ureter along the way.  There were no lesions appreciated.  A 6 x 22 French double-J ureteral stent was then advanced over the wire up to level the renal pelvis.  The wires partially withdrawn until full course of the in the renal pelvis.  The wire was then slowly withdrawn and focused over the bladder.  Bladder was then drained.  The patient was then cleaned and dried, repositioned supine position, reversed from anesthesia, and taken to the PACU in stable condition.  Plan: Patient will return next week for cystoscopy, stent removal.  Findings were discussed with her son.  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  On the right, etiology is less clear as this is not a typical occasion for a UPJ obstruction.  Despite this, benign etiology is suspected.  Given that she is asymptomatic, will will observation. Consider Lasix renogram in the future for baseline.  All questions answered.  [name], M.D.

## 2017-11-08 NOTE — Anesthesia Procedure Notes (Signed)
Procedure Name: LMA Insertion Date/Time: 11/08/2017 11:13 AM Performed by: Johnna Acosta, CRNA Pre-anesthesia Checklist: Patient identified, Emergency Drugs available, Suction available, Patient being monitored and Timeout performed Patient Re-evaluated:Patient Re-evaluated prior to induction Oxygen Delivery Method: Circle system utilized Preoxygenation: Pre-oxygenation with 100% oxygen Induction Type: IV induction LMA: LMA inserted LMA Size: 4.0 Tube type: Oral Number of attempts: 1 Placement Confirmation: positive ETCO2 and breath sounds checked- equal and bilateral Tube secured with: Tape Dental Injury: Teeth and Oropharynx as per pre-operative assessment

## 2017-11-08 NOTE — Interval H&P Note (Signed)
History and Physical Interval Note:  11/08/2017 10:22 AM  Tracy Grimes  has presented today for surgery, with the diagnosis of Bilateral hydronephrosis, Left proximal ureteral thickening  The various methods of treatment have been discussed with the patient and family. After consideration of risks, benefits and other options for treatment, the patient has consented to  Procedure(s): CYSTOSCOPY WITH URETEROSCOPY (Bilateral) CYSTOSCOPY WITH RETROGRADE PYELOGRAM (Bilateral) CYSTOSCOPY WITH ureteral BIOPSY (Bilateral) CYSTOSCOPY WITH STENT PLACEMENT (Bilateral) HOLMIUM LASER APPLICATION - laser ablation of tumor (Bilateral) as a surgical intervention .  The patient's history has been reviewed, patient examined, no change in status, stable for surgery.  I have reviewed the patient's chart and labs.  Questions were answered to the patient's satisfaction.    RRR CTAB   [name]

## 2017-11-08 NOTE — Anesthesia Postprocedure Evaluation (Signed)
Anesthesia Post Note  Patient: Tracy Grimes  Procedure(s) Performed: CYSTOSCOPY WITH URETEROSCOPY (Bilateral Ureter) CYSTOSCOPY WITH RETROGRADE PYELOGRAM (Bilateral Ureter) CYSTOSCOPY WITH STENT PLACEMENT (Bilateral Ureter)  Patient location during evaluation: PACU Anesthesia Type: General Level of consciousness: awake and alert Pain management: pain level controlled Vital Signs Assessment: post-procedure vital signs reviewed and stable Respiratory status: spontaneous breathing and respiratory function stable Cardiovascular status: stable Anesthetic complications: no     Last Vitals:  Vitals:   11/08/17 1206 11/08/17 1221  BP: (!) 145/80 (!) 144/81  Pulse: 70 68  Resp: (!) 22 13  Temp: 36.5 C   SpO2: 100% 100%    Last Pain:  Vitals:   11/08/17 1206  TempSrc:   PainSc: Asleep                 KEPHART,WILLIAM K

## 2017-11-08 NOTE — Anesthesia Preprocedure Evaluation (Signed)
Anesthesia Evaluation  Patient identified by MRN, date of birth, ID band Patient awake    Reviewed: Allergy & Precautions, H&P , NPO status , Patient's Chart, lab work & pertinent test results, reviewed documented beta blocker date and time   History of Anesthesia Complications (+) PONV and history of anesthetic complications  Airway Mallampati: I  TM Distance: >3 FB Neck ROM: full    Dental  (+) Dental Advidsory Given, Teeth Intact   Pulmonary neg pulmonary ROS,           Cardiovascular Exercise Tolerance: Good (-) hypertension(-) angina(-) CAD, (-) Past MI, (-) Cardiac Stents and (-) CABG + dysrhythmias (Ventricular bigeminy) Atrial Fibrillation (-) Valvular Problems/Murmurs     Neuro/Psych negative neurological ROS  negative psych ROS   GI/Hepatic Neg liver ROS, GERD  ,  Endo/Other  negative endocrine ROS  Renal/GU negative Renal ROS  negative genitourinary   Musculoskeletal   Abdominal   Peds  Hematology negative hematology ROS (+)   Anesthesia Other Findings Past Medical History: No date: Atrial fibrillation ([place]) 1982: Cancer ([place])     Comment:  Breast No date: Complication of anesthesia No date: PONV (postoperative nausea and vomiting)   Reproductive/Obstetrics negative OB ROS                             Anesthesia Physical Anesthesia Plan  ASA: II  Anesthesia Plan: General   Post-op Pain Management:    Induction:   PONV Risk Score and Plan: 4 or greater and Propofol infusion, Scopolamine patch - Pre-op, Ondansetron and Dexamethasone  Airway Management Planned:   Additional Equipment:   Intra-op Plan:   Post-operative Plan:   Informed Consent: I have reviewed the patients History and Physical, chart, labs and discussed the procedure including the risks, benefits and alternatives for the proposed anesthesia with the patient or authorized representative who has  indicated his/her understanding and acceptance.   Dental Advisory Given  Plan Discussed with: Anesthesiologist, CRNA and Surgeon  Anesthesia Plan Comments:         Anesthesia Quick Evaluation

## 2017-11-08 NOTE — Anesthesia Post-op Follow-up Note (Signed)
Anesthesia QCDR form completed.        

## 2017-11-08 NOTE — Discharge Instructions (Signed)
You have a ureteral stent in place.  This is a tube that extends from your kidney to your bladder.  This may cause urinary bleeding, burning with urination, and urinary frequency.  Please call our office or present to the ED if you develop fevers >101 or pain which is not able to be controlled with oral pain medications.  You may be given either Flomax and/ or ditropan to help with bladder spasms and stent pain in addition to pain medications.   ° °[facility] °[address], [address] °[place], [place] [address] °[phone] ° ° ° °AMBULATORY SURGERY  °DISCHARGE INSTRUCTIONS ° ° °1) The drugs that you were given will stay in your system until tomorrow so for the next 24 hours you should not: ° °A) Drive an automobile °B) Make any legal decisions °C) Drink any alcoholic beverage ° ° °2) You may resume regular meals tomorrow.  Today it is better to start with liquids and gradually work up to solid foods. ° °You may eat anything you prefer, but it is better to start with liquids, then soup and crackers, and gradually work up to solid foods. ° ° °3) Please notify your doctor immediately if you have any unusual bleeding, trouble breathing, redness and pain at the surgery site, drainage, fever, or pain not relieved by medication. ° ° ° °4) Additional Instructions: ° ° ° ° ° ° ° °Please contact your physician with any problems or Same Day Surgery at [phone], Monday through Friday 6 am to 4 pm, or [place] [facility] [facility] number at [phone]. °

## 2017-11-08 NOTE — Addendum Note (Signed)
Addendum  created 11/08/17 1243 by Gunnar Fusi, MD   Order list changed, Order sets accessed

## 2017-11-08 NOTE — Transfer of Care (Signed)
Immediate Anesthesia Transfer of Care Note  Patient: Tracy Grimes  Procedure(s) Performed: CYSTOSCOPY WITH URETEROSCOPY (Bilateral Ureter) CYSTOSCOPY WITH RETROGRADE PYELOGRAM (Bilateral Ureter) CYSTOSCOPY WITH STENT PLACEMENT (Bilateral Ureter)  Patient Location: PACU  Anesthesia Type:General  Level of Consciousness: awake and alert   Airway & Oxygen Therapy: Patient Spontanous Breathing and Patient connected to face mask oxygen  Post-op Assessment: Report given to RN and Post -op Vital signs reviewed and stable  Post vital signs: Reviewed and stable  Last Vitals:  Vitals:   11/08/17 1001  BP: 128/88  Pulse: 84  Resp: 17  Temp: (!) 36 C  SpO2: 95%    Last Pain:  Vitals:   11/08/17 1001  TempSrc: Tympanic         Complications: No apparent anesthesia complications

## 2017-11-09 ENCOUNTER — Encounter: Payer: Self-pay | Admitting: Urology

## 2017-11-17 ENCOUNTER — Ambulatory Visit (INDEPENDENT_AMBULATORY_CARE_PROVIDER_SITE_OTHER): Payer: Medicare Other | Admitting: Urology

## 2017-11-17 ENCOUNTER — Encounter: Payer: Self-pay | Admitting: Urology

## 2017-11-17 VITALS — BP 115/66 | HR 97 | Ht 62.0 in | Wt 120.0 lb

## 2017-11-17 DIAGNOSIS — N1330 Unspecified hydronephrosis: Secondary | ICD-10-CM | POA: Diagnosis not present

## 2017-11-17 LAB — URINALYSIS, COMPLETE
BILIRUBIN UA: NEGATIVE
GLUCOSE, UA: NEGATIVE
KETONES UA: NEGATIVE
NITRITE UA: NEGATIVE
SPEC GRAV UA: 1.01 (ref 1.005–1.030)
UUROB: 0.2 mg/dL (ref 0.2–1.0)
pH, UA: 6 (ref 5.0–7.5)

## 2017-11-17 LAB — MICROSCOPIC EXAMINATION

## 2017-11-17 MED ORDER — LIDOCAINE HCL 2 % EX GEL
1.0000 "application " | Freq: Once | CUTANEOUS | Status: AC
  Administered 2017-11-17: 1 via URETHRAL

## 2017-11-17 MED ORDER — CIPROFLOXACIN HCL 500 MG PO TABS
500.0000 mg | ORAL_TABLET | Freq: Once | ORAL | Status: AC
  Administered 2017-11-17: 500 mg via ORAL

## 2017-11-17 NOTE — Progress Notes (Signed)
   11/17/17  CC:  Chief Complaint  Patient presents with  . Cysto Stent Removal    HPI: 83 year old female with incidental bilateral hydronephrosis status post diagnostic ureteroscopy.  This showed extrinsic compression bilaterally, probable left-sided UPJ obstruction and unclear etiology on the right given the location.  She returns to the office today for cystoscopy, stent removal.  She is tolerating the stents extremely well.  She has no voiding complaints today.  No fevers or chills.  Prior to the CT scan finding, she denies any history of flank pain.  Her creatinine was normal.  She had no previous scans for comparison.  Blood pressure 115/66, pulse 97, height 5\' 2"  (1.575 m), weight 120 lb (54.4 kg). NED. A&Ox3.   No respiratory distress   Abd soft, NT, ND Normal external genitalia with patent urethral meatus  Cystoscopy/ Stent removal procedure  Patient identification was confirmed, informed consent was obtained, and patient was prepped using Betadine solution.  Lidocaine jelly was administered per urethral meatus.    Preoperative abx where received prior to procedure.    Procedure: - Flexible cystoscope introduced, without any difficulty.   - Thorough search of the bladder revealed:    normal urethral meatus  Stent seen emanating from left ureteral orifice, grasped with stent graspers, and removed in entirety.    The scope was then reintroduced and the same procedure was used to remove the stent on the right.  Post-Procedure: - Patient tolerated the procedure well  Assessment/ Plan:  1. 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 Given her lack of symptoms, normal creatinine I suspect this is congenital There does appear to be a crossing vessel on the left further supporting the diagnosis, right side is more unclear- given the location of the proximal ureter rather than the UPJ, UPJ obstruction is less likely  although it is very similar to the presentation on the left No other intra-abdominal pathology or intraluminal pathology Given that she was previously asymptomatic, she would like to continue observation which seems quite reasonable at age We discussed obtaining a MAG3 Lasix renogram for baseline and if her symptoms worsen, will repeat for comparison Plan to follow clinically-she is agreeable with this plan - Urinalysis, Complete - ciprofloxacin (CIPRO) tablet 500 mg - lidocaine (XYLOCAINE) 2 % jelly 1 application - NM Renal Imaging Flow W/Pharm; Future   [name], MD

## 2017-12-26 ENCOUNTER — Ambulatory Visit
Admission: RE | Admit: 2017-12-26 | Discharge: 2017-12-26 | Disposition: A | Discharge status: Home / Self Care | Payer: Medicare Other | Source: Ambulatory Visit | Attending: Urology | Admitting: Urology

## 2017-12-26 DIAGNOSIS — N1330 Unspecified hydronephrosis: Secondary | ICD-10-CM | POA: Insufficient documentation

## 2017-12-26 MED ORDER — FUROSEMIDE 10 MG/ML IJ SOLN
20.0000 mg | Freq: Once | INTRAMUSCULAR | Status: AC
  Administered 2017-12-26: 20 mg via INTRAVENOUS
  Filled 2017-12-26 (×2): qty 2

## 2017-12-26 MED ORDER — TECHNETIUM TC 99M MERTIATIDE
5.0000 | Freq: Once | INTRAVENOUS | Status: AC | PRN
  Administered 2017-12-26: 5.25 via INTRAVENOUS

## 2017-12-27 ENCOUNTER — Telehealth: Payer: Self-pay | Admitting: Urology

## 2017-12-27 NOTE — Telephone Encounter (Signed)
I personally reviewed the patient's Lasix renogram study with her by telephone today.  Recommend no further intervention or workup as functionally, her kidneys excrete quite well without evidence of obstruction.  She would like a copy of the report to be mailed to her home for her personal records.  I will help accommodate this.  [name], MD

## 2017-12-28 ENCOUNTER — Telehealth: Payer: Self-pay | Admitting: Urology

## 2017-12-28 NOTE — Telephone Encounter (Signed)
Mailed to patient  Sharyn Lull

## 2018-05-08 ENCOUNTER — Emergency Department
Admission: EM | Admit: 2018-05-08 | Discharge: 2018-05-08 | Disposition: A | Discharge status: Home / Self Care | Payer: Medicare Other | Attending: Emergency Medicine | Admitting: Emergency Medicine

## 2018-05-08 ENCOUNTER — Encounter: Payer: Self-pay | Admitting: Emergency Medicine

## 2018-05-08 ENCOUNTER — Emergency Department: Payer: Medicare Other

## 2018-05-08 DIAGNOSIS — R42 Dizziness and giddiness: Secondary | ICD-10-CM | POA: Diagnosis not present

## 2018-05-08 LAB — URINALYSIS, COMPLETE (UACMP) WITH MICROSCOPIC
BACTERIA UA: NONE SEEN
BILIRUBIN URINE: NEGATIVE
Glucose, UA: NEGATIVE mg/dL
HGB URINE DIPSTICK: NEGATIVE
KETONES UR: 5 mg/dL — AB
LEUKOCYTES UA: NEGATIVE
NITRITE: NEGATIVE
PROTEIN: NEGATIVE mg/dL
Specific Gravity, Urine: 1.01 (ref 1.005–1.030)
Squamous Epithelial / LPF: NONE SEEN (ref 0–5)
pH: 7 (ref 5.0–8.0)

## 2018-05-08 LAB — COMPREHENSIVE METABOLIC PANEL
ALK PHOS: 72 U/L (ref 38–126)
ALT: 15 U/L (ref 14–54)
ANION GAP: 9 (ref 5–15)
AST: 20 U/L (ref 15–41)
Albumin: 4.3 g/dL (ref 3.5–5.0)
BUN: 13 mg/dL (ref 6–20)
CALCIUM: 9.3 mg/dL (ref 8.9–10.3)
CHLORIDE: 105 mmol/L (ref 101–111)
CO2: 25 mmol/L (ref 22–32)
Creatinine, Ser: 0.52 mg/dL (ref 0.44–1.00)
GFR calc non Af Amer: >60 mL/min (ref >60)
Glucose, Bld: 132 mg/dL — ABNORMAL HIGH (ref 65–99)
POTASSIUM: 3.4 mmol/L — AB (ref 3.5–5.1)
SODIUM: 139 mmol/L (ref 135–145)
Total Bilirubin: 0.8 mg/dL (ref 0.3–1.2)
Total Protein: 6.8 g/dL (ref 6.5–8.1)

## 2018-05-08 LAB — CBC
HCT: 44.2 % (ref 35.0–47.0)
Hemoglobin: 15.1 g/dL (ref 12.0–16.0)
MCH: 32.8 pg (ref 26.0–34.0)
MCHC: 34.2 g/dL (ref 32.0–36.0)
MCV: 96 fL (ref 80.0–100.0)
PLATELETS: 225 10*3/uL (ref 150–440)
RBC: 4.61 MIL/uL (ref 3.80–5.20)
RDW: 13.7 % (ref 11.5–14.5)
WBC: 8.2 10*3/uL (ref 3.6–11.0)

## 2018-05-08 LAB — LIPASE, BLOOD: Lipase: 28 U/L (ref 11–51)

## 2018-05-08 MED ORDER — ONDANSETRON 4 MG PO TBDP
ORAL_TABLET | ORAL | Status: AC
  Filled 2018-05-08: qty 1

## 2018-05-08 MED ORDER — IOPAMIDOL (ISOVUE-370) INJECTION 76%
75.0000 mL | Freq: Once | INTRAVENOUS | Status: AC | PRN
  Administered 2018-05-08: 75 mL via INTRAVENOUS

## 2018-05-08 MED ORDER — ONDANSETRON 4 MG PO TBDP
4.0000 mg | ORAL_TABLET | Freq: Once | ORAL | Status: AC | PRN
  Administered 2018-05-08: 4 mg via ORAL

## 2018-05-08 NOTE — ED Triage Notes (Signed)
Patient presents to the ED via EMS from home with severe dizziness when she woke up this morning.  Patient states she fell into a chair this morning but did not fall to the ground or hit her head.  Patient has had several episodes of extreme fatigue for the past few days.  Denies history of dizziness.  Patient denies unilateral weakness, difficulty talking or confusion.  Patient denies headache. Patient denies chest pain and shortness of breath.

## 2018-05-08 NOTE — Discharge Instructions (Signed)
Please return if the symptoms return. Please follow-up with the neurologist Dr.Shah for the vertigo and with Dr. Saralyn Pilar your cardiologist for these episodes of extreme tiredness that you get from time to time. it may be worthwhile for you to get put on a loop recorder to monitor your heart. Please also continue to follow-up with your primary care doctor

## 2018-05-08 NOTE — ED Notes (Signed)
First Nurse Note:  Daughter at chair side, asking when patient can be placed in a bed.  No change since triage per daughter, advised to let us know of any change in symptoms.  Charge Nurse aware.

## 2018-05-08 NOTE — ED Provider Notes (Signed)
[facility] Emergency Department Provider Note   ____________________________________________   First MD Initiated Contact with Patient 05/08/18 1121     (approximate)  I have reviewed the triage vital signs and the nursing notes.   HISTORY  Chief Complaint Dizziness    HPI Tracy Grimes is a 84 y.o. female Who complains of severe spinning when she woke up this morning she had trouble walking. She had a lot of nausea as well but was unable to vomit as her stomach was empty. She also reports she's had several episodes of extreme fatigue necessitating her lying down. This has happened intermittently for the last several weeks or months. She did not feel weak or lightheaded with the fatigue just that she had to lay down thinking that. This lasts for an hour to an she's fine gets her usual high energy level back. Patient reports the vertigo lasted from about 7 this morning until about 11. it ended before I saw her. She is back to normal now.patient previously had an episode with vertigo and then 1 other episode with numbness of the left side of the arm which is still slowly resolving that episode was more than a month ago.   Past Medical History:  Diagnosis Date  . Atrial fibrillation ([facility])   . Cancer ([facility]) 1982   Breast  . Complication of anesthesia   . PONV (postoperative nausea and vomiting)     There are no active problems to display for this patient.   Past Surgical History:  Procedure Laterality Date  . ABDOMINAL HYSTERECTOMY    . CYSTOSCOPY W/ RETROGRADES Bilateral 11/08/2017   Procedure: CYSTOSCOPY WITH RETROGRADE PYELOGRAM;  Surgeon: [name], MD;  Location: [facility] ORS;  Service: Urology;  Laterality: Bilateral;  . CYSTOSCOPY WITH STENT PLACEMENT Bilateral 11/08/2017   Procedure: CYSTOSCOPY WITH STENT PLACEMENT;  Surgeon: [name], MD;  Location: [facility] ORS;  Service: Urology;  Laterality: Bilateral;  . CYSTOSCOPY WITH URETEROSCOPY  Bilateral 11/08/2017   Procedure: CYSTOSCOPY WITH URETEROSCOPY;  Surgeon: [name], MD;  Location: [facility] ORS;  Service: Urology;  Laterality: Bilateral;  . [place]   missed ab  . KNEE SURGERY Left early 1990's   suspected baker's cyst  . MASTECTOMY Left 1982  . MASTECTOMY Right 1982  . THYROID SURGERY  1957    Prior to Admission medications   Medication Sig Start Date End Date Taking? Authorizing Provider  acetaminophen (TYLENOL) 500 MG tablet Take 500 mg by mouth every 6 (six) hours as needed for moderate pain or headache.    [provider]  Biotin 1000 MCG tablet Take 1,000 mcg by mouth daily.    [provider]  Calcium Carbonate-Vitamin D 600-400 MG-UNIT tablet Take 2 tablets by mouth daily.     [provider]  Multiple Vitamin (MULTIVITAMIN) tablet Take 1 tablet daily by mouth.    [provider]  Multiple Vitamins-Minerals (PRESERVISION AREDS 2 PO) Take 1 capsule by mouth 2 (two) times daily.    [provider]  omeprazole (PRILOSEC) 40 MG capsule Take 40 mg by mouth daily.  10/19/17 10/19/18  [provider]  oxybutynin (DITROPAN) 5 MG tablet Take 1 tablet (5 mg total) by mouth every 8 (eight) hours as needed for bladder spasms. Patient not taking: Reported on 11/17/2017 11/08/17   [name], MD  pyridOXINE (VITAMIN B-6) 100 MG tablet Take 100 mg by mouth daily.    [provider]  tamsulosin [facility])  0.4 MG CAPS capsule Take 1 capsule (0.4 mg total) by mouth daily. Patient not taking: Reported on 11/17/2017 11/08/17   [name], MD    Allergies Asa [aspirin]; Nsaids; Other; and Sulfa antibiotics  Family History  Problem Relation Age of Onset  . Bladder Cancer Neg Hx   . Kidney cancer Neg Hx     Social History Social History   Tobacco Use  . Smoking status: Never Smoker  . Smokeless tobacco: Never Used  Substance Use Topics  . Alcohol use: No    Frequency:  Never  . Drug use: No    Review of Systems  Constitutional: No fever/chills Eyes: No visual changes. ENT: No sore throat. Cardiovascular: Denies chest pain. Respiratory: Denies shortness of breath. Gastrointestinal: No abdominal pain.   nausea, no vomiting.  No diarrhea.  No constipation. Genitourinary: Negative for dysuria. Musculoskeletal: Negative for back pain. Skin: Negative for rash. Neurological: Negative for headaches, focal weakness   ____________________________________________   PHYSICAL EXAM:  VITAL SIGNS: ED Triage Vitals  Enc Vitals Group     BP 05/08/18 0943 (!) 144/77     Pulse Rate 05/08/18 0943 76     Resp 05/08/18 0943 18     Temp 05/08/18 0943 98.1 F (36.7 C)     Temp Source 05/08/18 0943 Oral     SpO2 05/08/18 0943 97 %     Weight 05/08/18 0944 119 lb (54 kg)     Height 05/08/18 0944 5\' 2"  (1.575 m)     Head Circumference --      Peak Flow --      Pain Score 05/08/18 0944 0     Pain Loc --      Pain Edu? --      Excl. in [place]? --     Constitutional: Alert and oriented. Well appearing and in no acute distress. Eyes: Conjunctivae are normal. PERRL. EOMI. Head: Atraumatic. Nose: No congestion/rhinnorhea. Mouth/Throat: Mucous membranes are moist.  Oropharynx non-erythematous. Neck: No stridor.no bruits Cardiovascular: Normal rate, regular rhythm. Grossly normal heart sounds.  Good peripheral circulation. Respiratory: Normal respiratory effort.  No retractions. Lungs CTAB. Gastrointestinal: Soft and nontender. No distention. No abdominal bruits. No CVA tenderness. Musculoskeletal: No lower extremity tenderness nor edema.  No joint effusions. Neurologic:  Normal speech and language. No gross focal neurologic deficits are appreciated.Specifically cranial nerves II through XII are intact cerebellar finger-nose rapid alternating movements in the hands and heel-to-shin are normal motor strength is 5 over 5 throughout sensation is normal except for patient  reporting that the left lateral upper arm still feels numb when she touches it with a towel. Light touch with my hand is normal Skin:  Skin is warm, dry and intact. No rash noted. Psychiatric: Mood and affect are normal. Speech and behavior are normal.  ____________________________________________   LABS (all labs ordered are listed, but only abnormal results are displayed)  Labs Reviewed  URINALYSIS, COMPLETE (UACMP) WITH MICROSCOPIC - Abnormal; Notable for the following components:      Result Value   Color, Urine YELLOW (*)    APPearance CLEAR (*)    Ketones, ur 5 (*)    All other components within normal limits  COMPREHENSIVE METABOLIC PANEL - Abnormal; Notable for the following components:   Potassium 3.4 (*)    Glucose, Bld 132 (*)    All other components within normal limits  LIPASE, BLOOD  CBC   ____________________________________________  EKG  KG read and interpreted by me shows normal sinus  rhythm rate of 78 normal axis no acute ST-T wave changes seen ____________________________________________  RADIOLOGY  ED MD interpretation: CT angiogram of the head and neck show no acute pathologychest x-ray reviewed by me shows no acute pathology  Official radiology report(s): Ct Angio Head W Or Wo Contrast  Result Date: 05/08/2018 CLINICAL DATA:  Severe dizziness.  This began earlier today. EXAM: CT ANGIOGRAPHY HEAD AND NECK TECHNIQUE: Multidetector CT imaging of the head and neck was performed using the standard protocol during bolus administration of intravenous contrast. Multiplanar CT image reconstructions and MIPs were obtained to evaluate the vascular anatomy. Carotid stenosis measurements (when applicable) are obtained utilizing NASCET criteria, using the distal internal carotid diameter as the denominator. CONTRAST:  80mL ISOVUE-370 IOPAMIDOL (ISOVUE-370) INJECTION 76% COMPARISON:  None. FINDINGS: CT HEAD Calvarium and skull base: No fracture or destructive lesion.  Mastoids and middle ears are grossly clear. Paranasal sinuses: Imaged portions are clear. Orbits: Negative. Brain: No evidence of acute abnormality, including acute infarct, hemorrhage, hydrocephalus, or mass lesion. Mild atrophy, not unexpected for age. Hypoattenuation of white matter, consistent with small vessel disease. CTA NECK Aortic arch: Standard branching. Imaged portion shows no evidence of aneurysm or dissection. No significant stenosis of the major arch vessel origins. Right carotid system: Minor atheromatous change. No evidence of dissection, stenosis (50% or greater) or occlusion. Left carotid system: Minor atheromatous change. No evidence of dissection, stenosis (50% or greater) or occlusion. Vertebral arteries: Codominant. No evidence of dissection, stenosis (50% or greater) or occlusion. Nonvascular soft tissue large goiter, with substernal extension greater on the LEFT. Large air cyst LEFT upper lobe, with apparent peripheral scarring. Central branching tubular structure, incompletely evaluated. Spondylosis, worst at C5-C6. CTA HEAD Anterior circulation: Minor cavernous carotid calcification. Widely patent supraclinoid and ICA termini, as well as proximal anterior and middle cerebral arteries. No significant stenosis, proximal occlusion, aneurysm, or vascular malformation. Posterior circulation: Basilar artery widely patent with vertebrals codominant. No significant stenosis, proximal occlusion, aneurysm, or vascular malformation. Venous sinuses: As permitted by contrast timing, patent. Anatomic variants: None of significance. Delayed phase:   No abnormal intracranial enhancement. Review of the MIP images confirms the above findings IMPRESSION: Unremarkable CTA head neck. No intracranial or extracranial stenosis, dissection, or large vessel occlusion. No visible acute stroke, intracranial hemorrhage, or abnormal postcontrast enhancement. Incompletely evaluated LEFT upper lobe pathology, large air  cyst, peripheral scarring, and central branching tubular structure, incompletely evaluated. Two-view chest radiograph recommended for further evaluation. Large goiter. Electronically Signed   By: Staci Righter M.D.   On: 05/08/2018 13:00   Ct Angio Neck W And/or Wo Contrast  Result Date: 05/08/2018 CLINICAL DATA:  Severe dizziness.  This began earlier today. EXAM: CT ANGIOGRAPHY HEAD AND NECK TECHNIQUE: Multidetector CT imaging of the head and neck was performed using the standard protocol during bolus administration of intravenous contrast. Multiplanar CT image reconstructions and MIPs were obtained to evaluate the vascular anatomy. Carotid stenosis measurements (when applicable) are obtained utilizing NASCET criteria, using the distal internal carotid diameter as the denominator. CONTRAST:  65mL ISOVUE-370 IOPAMIDOL (ISOVUE-370) INJECTION 76% COMPARISON:  None. FINDINGS: CT HEAD Calvarium and skull base: No fracture or destructive lesion. Mastoids and middle ears are grossly clear. Paranasal sinuses: Imaged portions are clear. Orbits: Negative. Brain: No evidence of acute abnormality, including acute infarct, hemorrhage, hydrocephalus, or mass lesion. Mild atrophy, not unexpected for age. Hypoattenuation of white matter, consistent with small vessel disease. CTA NECK Aortic arch: Standard branching. Imaged portion shows no evidence of aneurysm  or dissection. No significant stenosis of the major arch vessel origins. Right carotid system: Minor atheromatous change. No evidence of dissection, stenosis (50% or greater) or occlusion. Left carotid system: Minor atheromatous change. No evidence of dissection, stenosis (50% or greater) or occlusion. Vertebral arteries: Codominant. No evidence of dissection, stenosis (50% or greater) or occlusion. Nonvascular soft tissue large goiter, with substernal extension greater on the LEFT. Large air cyst LEFT upper lobe, with apparent peripheral scarring. Central branching  tubular structure, incompletely evaluated. Spondylosis, worst at C5-C6. CTA HEAD Anterior circulation: Minor cavernous carotid calcification. Widely patent supraclinoid and ICA termini, as well as proximal anterior and middle cerebral arteries. No significant stenosis, proximal occlusion, aneurysm, or vascular malformation. Posterior circulation: Basilar artery widely patent with vertebrals codominant. No significant stenosis, proximal occlusion, aneurysm, or vascular malformation. Venous sinuses: As permitted by contrast timing, patent. Anatomic variants: None of significance. Delayed phase:   No abnormal intracranial enhancement. Review of the MIP images confirms the above findings IMPRESSION: Unremarkable CTA head neck. No intracranial or extracranial stenosis, dissection, or large vessel occlusion. No visible acute stroke, intracranial hemorrhage, or abnormal postcontrast enhancement. Incompletely evaluated LEFT upper lobe pathology, large air cyst, peripheral scarring, and central branching tubular structure, incompletely evaluated. Two-view chest radiograph recommended for further evaluation. Large goiter. Electronically Signed   By: Staci Righter M.D.   On: 05/08/2018 13:00    ____________________________________________   PROCEDURES  Procedure(s) performed:   Procedures  Critical Care performed:   ____________________________________________   INITIAL IMPRESSION / ASSESSMENT AND PLAN / ED COURSE  discussed the patient with Dr. Doy Mince on call neurology she feels CT angiogram would be the way to go in and of course follow-up with neurology outpatient later. I relayed this to the patient.  Clinical Course as of May 09 1331  Tue May 08, 2018  1316 AST: 20 [PM]    Clinical Course User Index [PM] Nena Polio, MD     ____________________________________________   FINAL CLINICAL IMPRESSION(S) / ED DIAGNOSES  Final diagnoses:  Vertigo     ED Discharge Orders    None         Note:  This document was prepared using Dragon voice recognition software and may include unintentional dictation errors.    Nena Polio, MD 05/08/18 [phone]

## 2018-05-08 NOTE — ED Notes (Signed)
Pt unable to urinate at this time, given a specimen cup for when is able to void.

## 2018-05-08 NOTE — ED Notes (Signed)
Patient dry heaving in triage 

## 2018-05-08 NOTE — ED Notes (Signed)
Patient placed in bed, rails are up. Dajea RN aware of patient.

## 2018-05-08 NOTE — ED Notes (Signed)
First Nurse Note:  Patient to ED via [facility], new onset of dizziness this AM.  [place] into chair and called daughter who called EMS.  Complaining of nausea.  BP sitting and lying 163/80.  HR stable with change in position.  FSBS - 120.  On [facility] med, described as healthy and active 84 year old.

## 2018-05-25 ENCOUNTER — Other Ambulatory Visit: Payer: Self-pay | Admitting: Neurology

## 2018-05-25 DIAGNOSIS — R42 Dizziness and giddiness: Secondary | ICD-10-CM

## 2018-06-02 ENCOUNTER — Ambulatory Visit
Admission: RE | Admit: 2018-06-02 | Discharge: 2018-06-02 | Disposition: A | Discharge status: Home / Self Care | Payer: Medicare Other | Source: Ambulatory Visit | Attending: Neurology | Admitting: Neurology

## 2018-06-02 DIAGNOSIS — R42 Dizziness and giddiness: Secondary | ICD-10-CM

## 2018-12-01 ENCOUNTER — Encounter: Payer: Self-pay | Admitting: Emergency Medicine

## 2018-12-01 ENCOUNTER — Emergency Department
Admission: EM | Admit: 2018-12-01 | Discharge: 2018-12-01 | Disposition: A | Discharge status: Home / Self Care | Payer: Medicare Other | Attending: Emergency Medicine | Admitting: Emergency Medicine

## 2018-12-01 ENCOUNTER — Other Ambulatory Visit: Payer: Self-pay

## 2018-12-01 DIAGNOSIS — Z79899 Other long term (current) drug therapy: Secondary | ICD-10-CM | POA: Diagnosis not present

## 2018-12-01 DIAGNOSIS — Z853 Personal history of malignant neoplasm of breast: Secondary | ICD-10-CM | POA: Diagnosis not present

## 2018-12-01 DIAGNOSIS — H353120 Nonexudative age-related macular degeneration, left eye, stage unspecified: Secondary | ICD-10-CM | POA: Insufficient documentation

## 2018-12-01 DIAGNOSIS — H5315 Visual distortions of shape and size: Secondary | ICD-10-CM | POA: Diagnosis present

## 2018-12-01 MED ORDER — TETRACAINE HCL 0.5 % OP SOLN
1.0000 [drp] | Freq: Once | OPHTHALMIC | Status: AC
Start: 2018-12-01 — End: 2018-12-01
  Administered 2018-12-01: 1 [drp] via OPHTHALMIC
  Filled 2018-12-01: qty 4

## 2018-12-01 NOTE — ED Triage Notes (Signed)
Pt c/o "distortion" to vision. Having difficulty describing. Asking to see ophthalmologist now because thinks has detachment.  No flashes of light. Pulled for triage

## 2018-12-01 NOTE — ED Triage Notes (Signed)
Pt reports around 5pm noticing visual changes in her left eye; says when she looks at straight lines they are bowed or dented; husband was an opthalmologist and knows this is a sign of detached retina; denies pain; denies blurred vision; denies headache; pt ambulatory with steady gait; talking in complete coherent sentences;

## 2018-12-01 NOTE — ED Provider Notes (Signed)
[facility] Emergency Department Provider Note  ____________________________________________  Time seen: Approximately 7:32 PM  I have reviewed the triage vital signs and the nursing notes.   HISTORY  Chief Complaint Visual Field Change    HPI Tracy Grimes is a 84 y.o. female with a history of age-related macular degeneration and epiretinal membrane of the left eye, presents to the emergency department with new onset metamorphopsia of the left eye that started around 5:30 PM tonight. No reports of scotoma. Patient states that when she looks at a doorway, the left side of the doorway looks curved.  Patient has never had similar symptoms in the past and became concerned due to acute onset of symptoms.  Patient denies left eye pain, increased tearing, foreign body sensation or left eye pain.  She denies headache or nausea.  Patient's is under the care of Dr. Charlotte Sanes with [facility] and her last eye exam was in April of 2019.  Patient is concern for retinal detachment but she denies floaters or occlusion of her vision.  Patient's husband, Dr. Jefm Bryant was an ophthalmologist within the community and patient is concerned about retinal detachment based on what she has learned from her husband over the years.    Past Medical History:  Diagnosis Date  . Atrial fibrillation ([place])   . Cancer ([place]) 1982   Breast  . Complication of anesthesia   . PONV (postoperative nausea and vomiting)     There are no active problems to display for this patient.   Past Surgical History:  Procedure Laterality Date  . ABDOMINAL HYSTERECTOMY    . CYSTOSCOPY W/ RETROGRADES Bilateral 11/08/2017   Procedure: CYSTOSCOPY WITH RETROGRADE PYELOGRAM;  Surgeon: [name], MD;  Location: [facility] ORS;  Service: Urology;  Laterality: Bilateral;  . CYSTOSCOPY WITH STENT PLACEMENT Bilateral 11/08/2017   Procedure: CYSTOSCOPY WITH STENT PLACEMENT;  Surgeon: [name], MD;  Location: [facility] ORS;   Service: Urology;  Laterality: Bilateral;  . CYSTOSCOPY WITH URETEROSCOPY Bilateral 11/08/2017   Procedure: CYSTOSCOPY WITH URETEROSCOPY;  Surgeon: [name], MD;  Location: [facility] ORS;  Service: Urology;  Laterality: Bilateral;  . [place]   missed ab  . KNEE SURGERY Left early 1990's   suspected baker's cyst  . MASTECTOMY Left 1982  . MASTECTOMY Right 1982  . THYROID SURGERY  1957    Prior to Admission medications   Medication Sig Start Date End Date Taking? Authorizing Provider  acetaminophen (TYLENOL) 500 MG tablet Take 500 mg by mouth every 6 (six) hours as needed for moderate pain or headache.    [provider]  Biotin 1000 MCG tablet Take 1,000 mcg by mouth daily.    [provider]  Calcium Carbonate-Vitamin D 600-400 MG-UNIT tablet Take 2 tablets by mouth daily.     [provider]  Multiple Vitamin (MULTIVITAMIN) tablet Take 1 tablet daily by mouth.    [provider]  Multiple Vitamins-Minerals (PRESERVISION AREDS 2 PO) Take 1 capsule by mouth 2 (two) times daily.    [provider]  omeprazole (PRILOSEC) 40 MG capsule Take 40 mg by mouth daily.  10/19/17 10/19/18  [provider]  oxybutynin (DITROPAN) 5 MG tablet Take 1 tablet (5 mg total) by mouth every 8 (eight) hours as needed for bladder spasms. Patient not taking: Reported on 11/17/2017 11/08/17   [name], MD  pyridOXINE (VITAMIN B-6) 100 MG tablet Take 100 mg by mouth daily.    [provider]  tamsulosin (FLOMAX) 0.4 MG CAPS capsule Take 1 capsule (0.4 mg total) by mouth daily. Patient not taking: Reported on 11/17/2017 11/08/17   [name], MD    Allergies Asa [aspirin]; Nsaids; Other; and Sulfa antibiotics  Family History  Problem Relation Age of Onset  . Bladder Cancer Neg Hx   . Kidney cancer Neg Hx     Social History Social History   Tobacco Use  . Smoking status: Never Smoker  . Smokeless  tobacco: Never Used  Substance Use Topics  . Alcohol use: No    Frequency: Never  . Drug use: No     Review of Systems  Constitutional: No fever/chills Eyes: Patient has new onset left eye metamorphopsia. ENT: No upper respiratory complaints. Cardiovascular: no chest pain. Respiratory: no cough. No SOB. Gastrointestinal: No abdominal pain.  No nausea, no vomiting.  No diarrhea.  No constipation. Musculoskeletal: Negative for musculoskeletal pain. Skin: Negative for rash, abrasions, lacerations, ecchymosis. Neurological: Negative for headaches, focal weakness or numbness.   ____________________________________________   PHYSICAL EXAM:  VITAL SIGNS: ED Triage Vitals  Enc Vitals Group     BP 12/01/18 1916 (!) 142/86     Pulse Rate 12/01/18 1916 91     Resp 12/01/18 1916 17     Temp 12/01/18 1916 98.3 F (36.8 C)     Temp Source 12/01/18 1916 Oral     SpO2 12/01/18 1916 98 %     Weight 12/01/18 1917 120 lb (54.4 kg)     Height 12/01/18 1917 5\' 2"  (1.575 m)     Head Circumference --      Peak Flow --      Pain Score 12/01/18 1917 0     Pain Loc --      Pain Edu? --      Excl. in [place]? --      Constitutional: Alert and oriented. Well appearing and in no acute distress. Eyes: Conjunctivae are normal. PERRL. EOMI. Red light reflex is non-diminished. Tonometry readings are 15, 13 and 16.  Head: Atraumatic. ENT:      Ears: TMs are pearly.       Nose: No congestion/rhinnorhea.      Mouth/Throat: Mucous membranes are moist.  Cardiovascular: Normal rate, regular rhythm. Normal S1 and S2.  Good peripheral circulation. Respiratory: Normal respiratory effort without tachypnea or retractions. Lungs CTAB. Good air entry to the bases with no decreased or absent breath sounds. Gastrointestinal: Bowel sounds 4 quadrants. Soft and nontender to palpation. No guarding or rigidity. No palpable masses. No distention. No CVA tenderness. Musculoskeletal: Full range of motion to all  extremities. No gross deformities appreciated. Neurologic:  Normal speech and language. No gross focal neurologic deficits are appreciated.  Skin:  Skin is warm, dry and intact. No rash noted. Psychiatric: Mood and affect are normal. Speech and behavior are normal. Patient exhibits appropriate insight and judgement.   ____________________________________________   LABS (all labs ordered are listed, but only abnormal results are displayed)  Labs Reviewed - No data to display ____________________________________________  EKG   ____________________________________________  RADIOLOGY   No results found.  ____________________________________________    PROCEDURES  Procedure(s) performed:    Procedures    Medications  tetracaine (PONTOCAINE) 0.5 % ophthalmic solution 1 drop (1 drop Left Eye Given 12/01/18 2029)     ____________________________________________   INITIAL IMPRESSION / ASSESSMENT AND PLAN / ED COURSE  Pertinent labs & imaging results that were available during my care of the patient were reviewed by  me and considered in my medical decision making (see chart for details).  Review of the [place] CSRS was performed in accordance of the [place] prior to dispensing any controlled drugs.      Assessment and Plan:  Metamorphopsia Macular degeneration Patient presents to the emergency department with new onset metamorphopsia that started today.  Patient was concerned that she was experiencing an acute retinal detachment. Patient did not present with flashes of light or floaters.  Ophthalmology was consulted at patient's request. Dr. Neville Route was consulted and spoke with patient via phone.  Dr. Neville Route stated that he accredited metamorphopsia with worsening macular degeneration.  Dr. Neville Route offered to see patient on Monday as an outpatient.  Patient reported that she felt reassured by Dr. Sharene Butters feedback.  All patient questions were answered prior to discharge.   ____________________________________________  FINAL CLINICAL IMPRESSION(S) / ED DIAGNOSES  Final diagnoses:  Nonexudative age-related macular degeneration of left eye, unspecified stage      NEW MEDICATIONS STARTED DURING THIS VISIT:  ED Discharge Orders    None          This chart was dictated using voice recognition software/Dragon. Despite best efforts to proofread, errors can occur which can change the meaning. Any change was purely unintentional.    Karren Cobble 12/01/18 2307    Lavonia Drafts, MD 12/02/18 [phone]

## 2018-12-01 NOTE — ED Notes (Signed)
Visual acuity Right-20/40 Left-20/100 Both-20/50  Pt states left eye is very blurry because of drops recently placed.

## 2018-12-01 NOTE — ED Notes (Signed)
Pt states vision disturbances and some vision changes in left eye.

## 2019-02-02 IMAGING — CT CT ABD-PELV W/ CM
2 of 5 series · 15 of 46 positions shown, 17 images · IV contrast (APPLIED)
Comparison: None.

CLINICAL DATA: Upper abdominal pain for 1 month.

EXAM:
CT ABDOMEN AND PELVIS WITH CONTRAST
TECHNIQUE: Multidetector CT imaging of the abdomen and pelvis was performed
using the standard protocol following bolus administration of
intravenous contrast.
CONTRAST:  100mL 2V0HR6-IAA IOPAMIDOL (2V0HR6-IAA) INJECTION 61%

[Series 2: routine abd/pel with · axial · 0.70mm/px · z∈[-481,-106]mm · 12 of 86 slices shown, 14 images]
[im 6/86  soft-tissue]
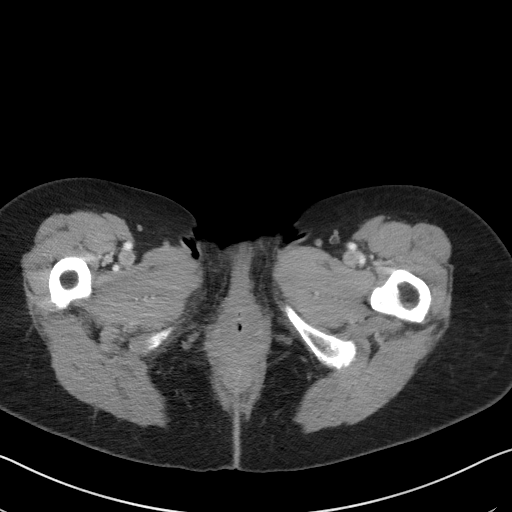
[im 6/86  bone]
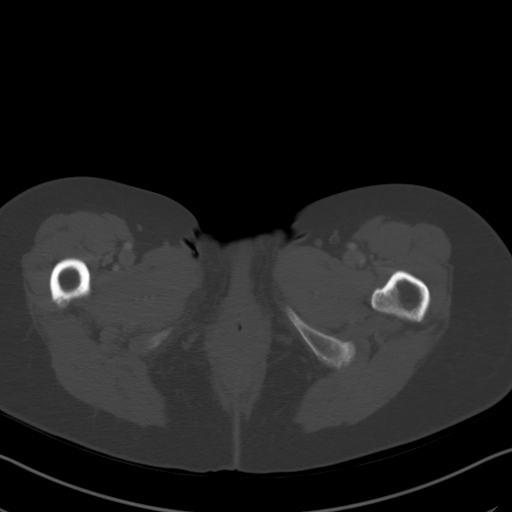
[im 16/86  soft-tissue]
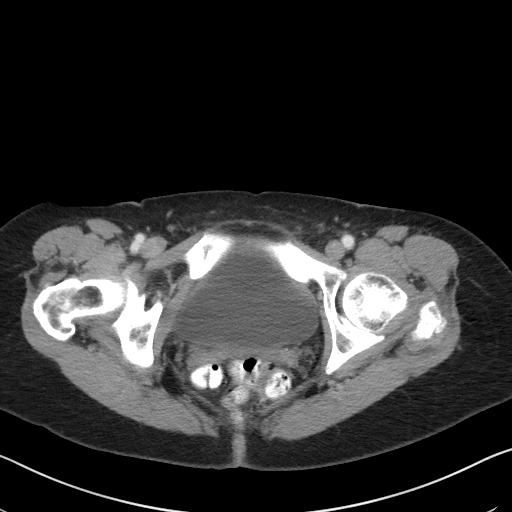
[im 21/86  soft-tissue]
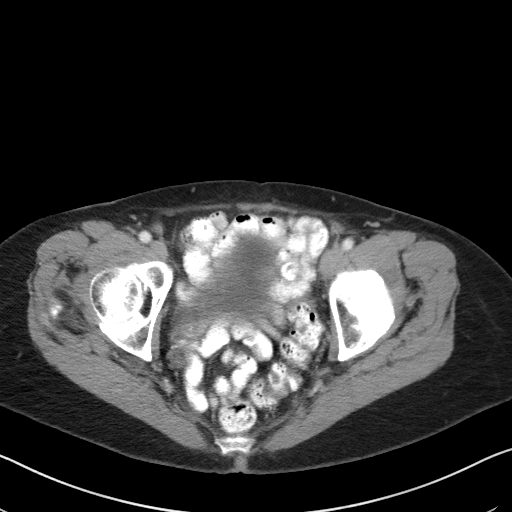
[im 26/86  soft-tissue]
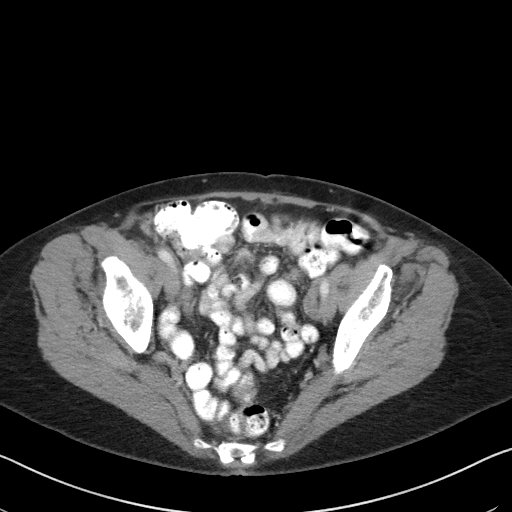
[im 36/86  soft-tissue]
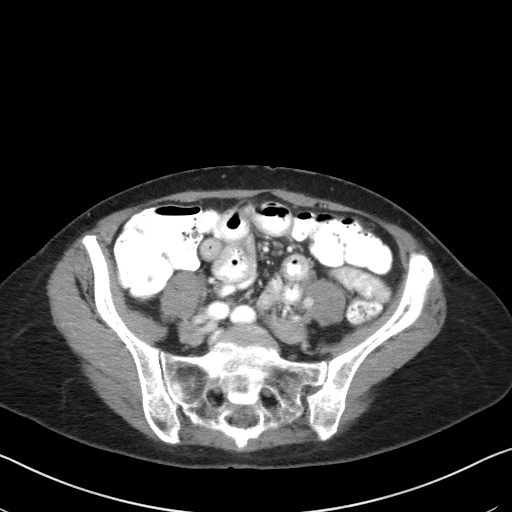
[im 41/86  soft-tissue]
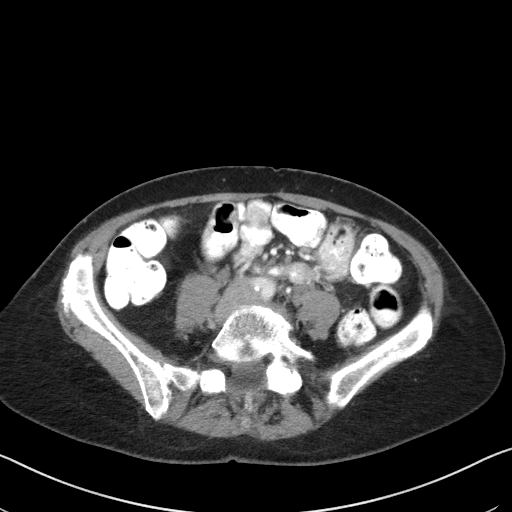
[im 46/86  soft-tissue]
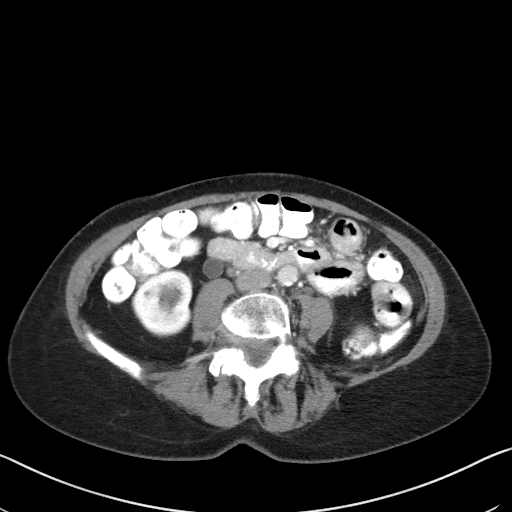
[im 56/86  soft-tissue]
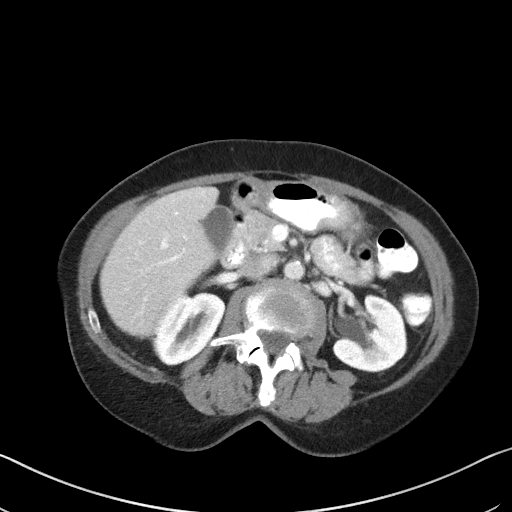
[im 61/86  soft-tissue]
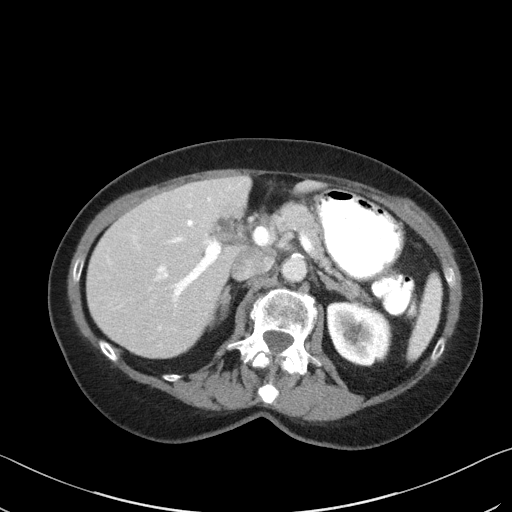
[im 61/86  bone]
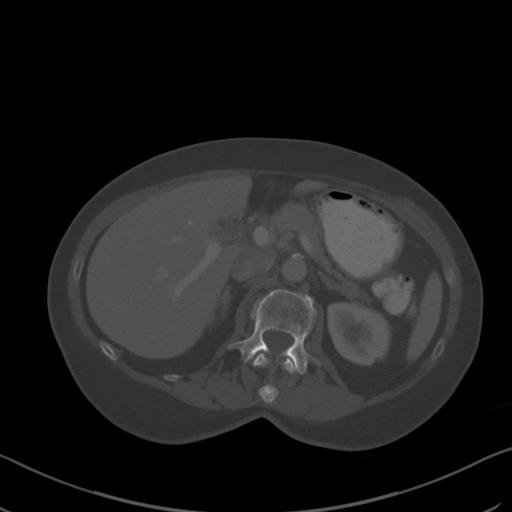
[im 66/86  soft-tissue]
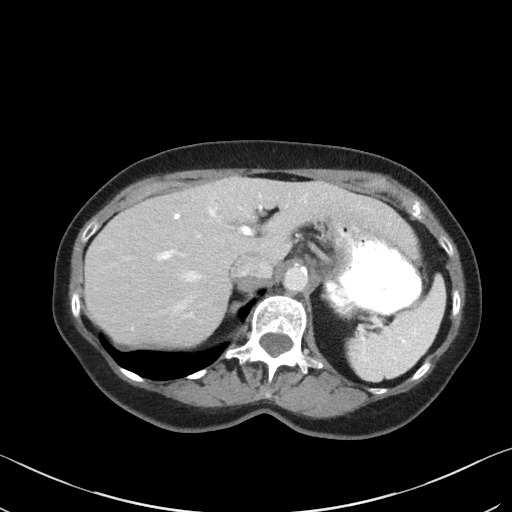
[im 76/86  soft-tissue]
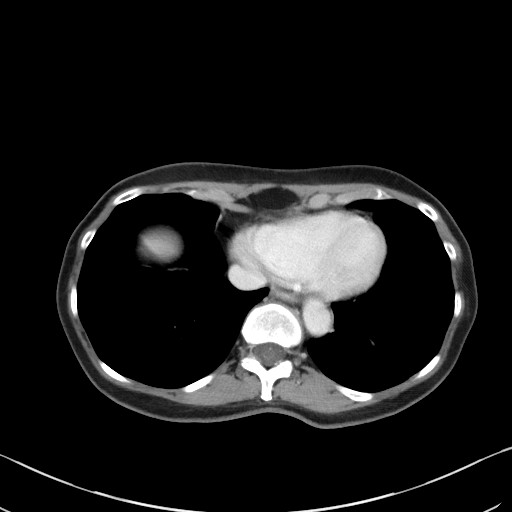
[im 81/86  soft-tissue]
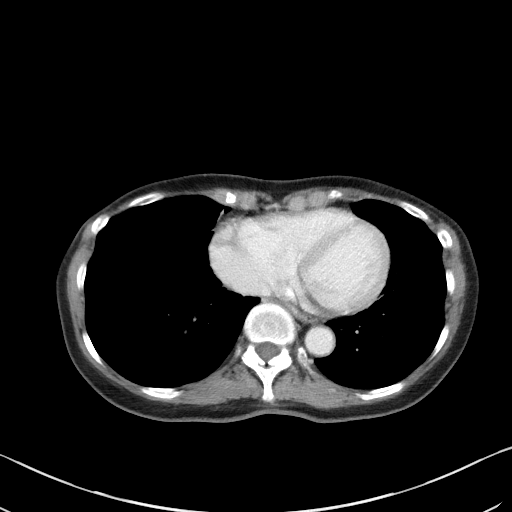

[Series 5: coronal st · coronal · 0.67mm/px · 3 of 73 slices shown]
[im 25/73  soft-tissue]
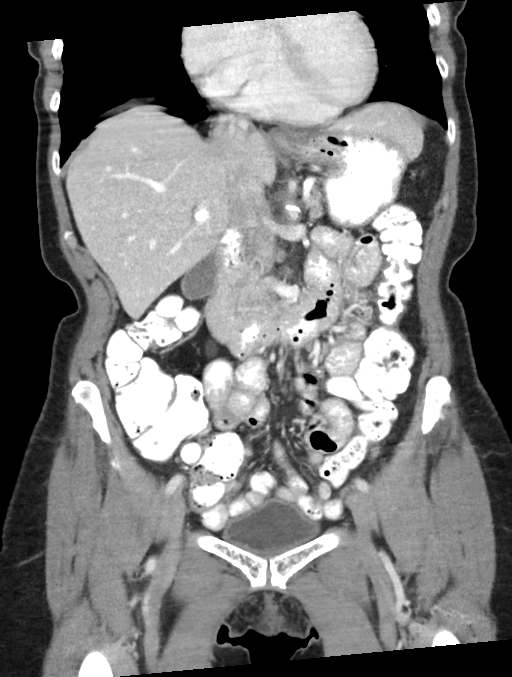
[im 33/73  soft-tissue]
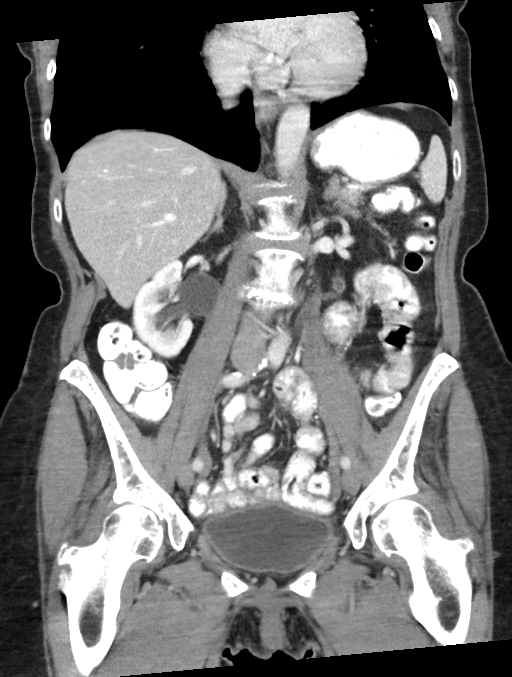
[im 41/73  soft-tissue]
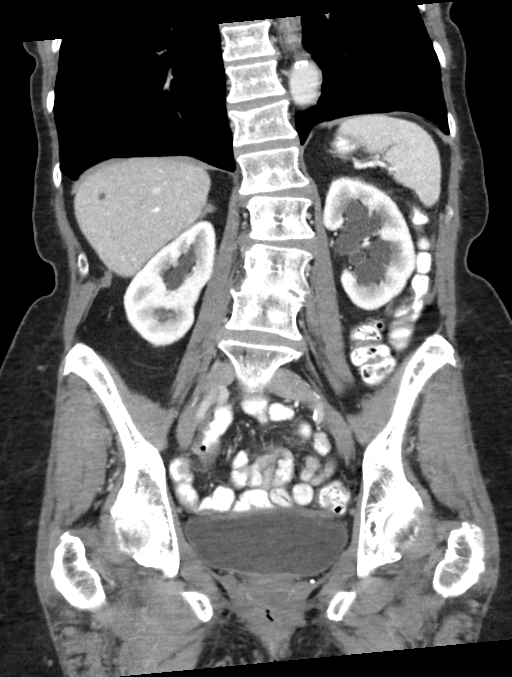

[15 of 46 positions shown; findings below may reference images not displayed]

FINDINGS: Lower chest:  Right middle lobe atelectasis.

Hepatobiliary: 6 mm hypoattenuating lesion posterior right liver is
too small to characterize but likely benign and probably a cyst.
There is no evidence for gallstones, gallbladder wall thickening, or
pericholecystic fluid. No intrahepatic or extrahepatic biliary
dilation.

Pancreas: No focal mass lesion. No dilatation of the main duct. No
intraparenchymal cyst. No peripancreatic edema.

Spleen: No splenomegaly. No focal mass lesion.

Adrenals/Urinary Tract: No adrenal nodule or mass.

There is mild to moderate bilateral hydronephrosis. On the left,
this is associated with circumferential wall enhancement in the
ureter just proximal to an abrupt transition in the proximal ureter,
just distal to the UPJ (image 38 series 2 and coronal image 34
series 5).

Similar degree of hydronephrosis noted in the right kidney with
dilation of the right ureter to location at or just below the UPJ.
No ureteral wall thickening or hyper enhancement is identified on
the right.

Distal ureters are decompressed.  Bladder is unremarkable.

Stomach/Bowel: Wall thickening at the esophagogastric junction may
be related to a hiatal hernia. Stomach otherwise unremarkable.
Duodenum is normally positioned as is the ligament of Treitz. No
small bowel wall thickening. No small bowel dilatation. The terminal
ileum is normal. The appendix is not visualized, but there is no
edema or inflammation in the region of the cecum. No gross colonic
mass. No colonic wall thickening. No substantial diverticular
change.

Vascular/Lymphatic: There is abdominal aortic atherosclerosis
without aneurysm. There is no gastrohepatic or hepatoduodenal
ligament lymphadenopathy. No intraperitoneal or retroperitoneal
lymphadenopathy. No pelvic sidewall lymphadenopathy.

Reproductive: Uterus surgically absent.  There is no adnexal mass.

Other: No intraperitoneal free fluid.

Musculoskeletal: Bone windows reveal no worrisome lytic or sclerotic
osseous lesions.
IMPRESSION: 1. Bilateral mild to moderate hydronephrosis with obstruction on
each side at the proximal ureter, just distal to the UPJ. On the
left there is some mild circumferential wall thickening and
hyperenhancement in the ureter just proximal to the abrupt
transition. No gross obstructing mass lesion is seen on either side.
There is no lymphadenopathy. No retroperitoneal fibrosis. Given
changes in the proximal left ureter, urological consultation
suggested.
2. 6 mm hypoattenuating right liver lesion, too small to
characterize but likely benign.
3.  Aortic Atherosclerois (GNKC8-170.0)
These results will be called to the ordering clinician or
representative by the Radiologist Assistant, and communication
documented in the PACS or zVision Dashboard.

## 2019-06-28 ENCOUNTER — Other Ambulatory Visit
Admission: RE | Admit: 2019-06-28 | Discharge: 2019-06-28 | Disposition: A | Discharge status: Home / Self Care | Payer: Medicare Other | Source: Ambulatory Visit | Attending: Internal Medicine | Admitting: Internal Medicine

## 2019-06-28 ENCOUNTER — Other Ambulatory Visit: Payer: Self-pay

## 2019-06-28 DIAGNOSIS — Z1159 Encounter for screening for other viral diseases: Secondary | ICD-10-CM | POA: Diagnosis present

## 2019-06-29 LAB — SARS CORONAVIRUS 2 (TAT 6-24 HRS): SARS Coronavirus 2: NEGATIVE

## 2020-08-05 ENCOUNTER — Encounter: Payer: Self-pay | Admitting: Dermatology

## 2020-08-05 ENCOUNTER — Other Ambulatory Visit: Payer: Self-pay

## 2020-08-05 ENCOUNTER — Ambulatory Visit (INDEPENDENT_AMBULATORY_CARE_PROVIDER_SITE_OTHER): Payer: Medicare Other | Admitting: Dermatology

## 2020-08-05 DIAGNOSIS — Z853 Personal history of malignant neoplasm of breast: Secondary | ICD-10-CM

## 2020-08-05 DIAGNOSIS — D229 Melanocytic nevi, unspecified: Secondary | ICD-10-CM

## 2020-08-05 DIAGNOSIS — Z1283 Encounter for screening for malignant neoplasm of skin: Secondary | ICD-10-CM

## 2020-08-05 DIAGNOSIS — L570 Actinic keratosis: Secondary | ICD-10-CM | POA: Diagnosis not present

## 2020-08-05 DIAGNOSIS — L820 Inflamed seborrheic keratosis: Secondary | ICD-10-CM | POA: Diagnosis not present

## 2020-08-05 DIAGNOSIS — D101 Benign neoplasm of tongue: Secondary | ICD-10-CM

## 2020-08-05 DIAGNOSIS — Z85828 Personal history of other malignant neoplasm of skin: Secondary | ICD-10-CM

## 2020-08-05 DIAGNOSIS — L821 Other seborrheic keratosis: Secondary | ICD-10-CM

## 2020-08-05 DIAGNOSIS — L814 Other melanin hyperpigmentation: Secondary | ICD-10-CM

## 2020-08-05 DIAGNOSIS — D1800 Hemangioma unspecified site: Secondary | ICD-10-CM

## 2020-08-05 DIAGNOSIS — L578 Other skin changes due to chronic exposure to nonionizing radiation: Secondary | ICD-10-CM

## 2020-08-05 NOTE — Progress Notes (Signed)
New Patient Visit  Subjective  Tracy Grimes is a 86 y.o. female who presents for the following: Annual Exam (lesion on the tongue that has been there for several months that she is concerned about). Other irritated skin lesions scattered on the trunk and extremities that she would like checked and treated with LN2 today.  The patient presents for Total-Body Skin Exam (TBSE) for skin cancer screening and mole check.  The following portions of the chart were reviewed this encounter and updated as appropriate:  Tobacco  Allergies  Meds  Problems  Med Hx  Surg Hx  Fam Hx     Review of Systems:  No other skin or systemic complaints except as noted in HPI or Assessment and Plan.  Objective  Well appearing patient in no apparent distress; mood and affect are within normal limits.  A full examination was performed including scalp, head, eyes, ears, nose, lips, neck, chest, axillae, abdomen, back, buttocks, bilateral upper extremities, bilateral lower extremities, hands, feet, fingers, toes, fingernails, and toenails. All findings within normal limits unless otherwise noted below.  Objective  R temple x 2 (2): Erythematous thin papules/macules with gritty scale.   Objective  Trunk, extremities (24): Erythematous keratotic or waxy stuck-on papule or plaque.   Objective  B/L breast: Clear to visual exam and palpation , no LAD.  Objective  Tongue: Firm nodule.  Assessment & Plan  AK (actinic keratosis) (2) R temple x 2  Destruction of lesion - R temple x 2 Complexity: simple   Destruction method: cryotherapy   Informed consent: discussed and consent obtained   Timeout:  patient name, date of birth, surgical site, and procedure verified Lesion destroyed using liquid nitrogen: Yes   Region frozen until ice ball extended beyond lesion: Yes   Outcome: patient tolerated procedure well with no complications   Post-procedure details: wound care instructions given    Inflamed  seborrheic keratosis (24) Trunk, extremities  Destruction of lesion - Trunk, extremities Complexity: simple   Destruction method: cryotherapy   Informed consent: discussed and consent obtained   Timeout:  patient name, date of birth, surgical site, and procedure verified Lesion destroyed using liquid nitrogen: Yes   Region frozen until ice ball extended beyond lesion: Yes   Outcome: patient tolerated procedure well with no complications   Post-procedure details: wound care instructions given    History of breast cancer in female B/L breast S/P B/L breast mastectomy in the 1980's -  Clear today; watch for recurrence. No lymphadenopathy today  Fibroma of tongue Tongue No hx of smoking or tobacco use -  plan to biopsy at follow up appointment in 6 weeks if still present   Lentigines - Scattered tan macules - Discussed due to sun exposure - Benign, observe - Call for any changes  Seborrheic Keratoses - Stuck-on, waxy, tan-brown papules and plaques  - Discussed benign etiology and prognosis. - Observe - Call for any changes  Melanocytic Nevi - Tan-brown and/or pink-flesh-colored symmetric macules and papules - Benign appearing on exam today - Observation - Call clinic for new or changing moles - Recommend daily use of broad spectrum spf 30+ sunscreen to sun-exposed areas.   Hemangiomas - Red papules - Discussed benign nature - Observe - Call for any changes  Actinic Damage - diffuse scaly erythematous macules with underlying dyspigmentation - Recommend daily broad spectrum sunscreen SPF 30+ to sun-exposed areas, reapply every 2 hours as needed.  - Call for new or changing lesions.  History of  Basal Cell Carcinoma of the Skin - R cheek, treated many years ago by Dr. Lacinda Axon  - No evidence of recurrence today - Recommend regular full body skin exams - Recommend daily broad spectrum sunscreen SPF 30+ to sun-exposed areas, reapply every 2 hours as needed.  - Call if any  new or changing lesions are noted between office visits  Skin cancer screening performed today.  Return in about 6 weeks (around 09/16/2020) for sun exposed areas - recheck AK's and ISK's; possible tongue biopsy .  Luther Redo, CMA, am acting as scribe for Sarina Ser, MD .  Documentation: I have reviewed the above documentation for accuracy and completeness, and I agree with the above.  Sarina Ser, MD

## 2020-08-10 ENCOUNTER — Encounter: Payer: Self-pay | Admitting: Dermatology

## 2020-09-16 ENCOUNTER — Other Ambulatory Visit: Payer: Self-pay

## 2020-09-16 ENCOUNTER — Encounter: Payer: Self-pay | Admitting: Dermatology

## 2020-09-16 ENCOUNTER — Ambulatory Visit (INDEPENDENT_AMBULATORY_CARE_PROVIDER_SITE_OTHER): Payer: Medicare Other | Admitting: Dermatology

## 2020-09-16 DIAGNOSIS — L820 Inflamed seborrheic keratosis: Secondary | ICD-10-CM | POA: Diagnosis not present

## 2020-09-16 DIAGNOSIS — L821 Other seborrheic keratosis: Secondary | ICD-10-CM | POA: Diagnosis not present

## 2020-09-16 DIAGNOSIS — D101 Benign neoplasm of tongue: Secondary | ICD-10-CM

## 2020-09-16 DIAGNOSIS — L578 Other skin changes due to chronic exposure to nonionizing radiation: Secondary | ICD-10-CM

## 2020-09-16 NOTE — Progress Notes (Signed)
   Follow-Up Visit   Subjective  Tracy Grimes is a 86 y.o. female who presents for the following: Skin Problem (1 month f/u growth on tongue, pt here for biopsy today ).  She says the spot on her trunk has significantly decreased in size and is almost not detectable.  Pt c/o irritated moles on her chest growing and itching.  The following portions of the chart were reviewed this encounter and updated as appropriate:  Tobacco  Allergies  Meds  Problems  Med Hx  Surg Hx  Fam Hx     Review of Systems:  No other skin or systemic complaints except as noted in HPI or Assessment and Plan.  Objective  Well appearing patient in no apparent distress; mood and affect are within normal limits.  A focused examination was performed including face, mouth, tongue, chest . Relevant physical exam findings are noted in the Assessment and Plan.  Objective  tongue: Smaller firm nodule   Images      Objective  Chest - Medial (Center) (34): Erythematous keratotic or waxy stuck-on papule or plaque.    Assessment & Plan  Fibroma of tongue -likely bite fibroma.  It has significantly decreased in size and is not symptomatic.  Photos compared and there is definitely decreased in size. tongue Improving, no biopsy needed today area appears to be healing   The patient will observe these symptoms, and report promptly any worsening or unexpected persistence.  If it increases or becomes symptomatic again, we will definitely want to do a biopsy on it.  Inflamed seborrheic keratosis (34) Chest - Medial (Center)  Destruction of lesion - Chest - Medial (Center) Complexity: simple   Destruction method: cryotherapy   Informed consent: discussed and consent obtained   Timeout:  patient name, date of birth, surgical site, and procedure verified Lesion destroyed using liquid nitrogen: Yes   Region frozen until ice ball extended beyond lesion: Yes   Outcome: patient tolerated procedure well with no  complications   Post-procedure details: wound care instructions given    Actinic Damage - diffuse scaly erythematous macules with underlying dyspigmentation - Recommend daily broad spectrum sunscreen SPF 30+ to sun-exposed areas, reapply every 2 hours as needed.  - Call for new or changing lesions.  Seborrheic Keratoses - Stuck-on, waxy, tan-brown papules and plaques  - Discussed benign etiology and prognosis. - Observe - Call for any changes  Return in about 3 months (around 12/17/2020).  IMarye Round, CMA, am acting as scribe for Sarina Ser, MD .  Documentation: I have reviewed the above documentation for accuracy and completeness, and I agree with the above.  Sarina Ser, MD

## 2020-09-16 NOTE — Patient Instructions (Signed)
Cryotherapy Aftercare  . Wash gently with soap and water everyday.   . Apply Vaseline and Band-Aid daily until healed.  

## 2020-12-07 ENCOUNTER — Other Ambulatory Visit: Payer: Self-pay

## 2020-12-08 ENCOUNTER — Encounter: Payer: Self-pay | Admitting: Dermatology

## 2020-12-23 ENCOUNTER — Ambulatory Visit: Payer: Medicare Other | Admitting: Dermatology

## 2021-04-22 ENCOUNTER — Ambulatory Visit: Payer: Medicare Other | Admitting: Dermatology

## 2021-05-18 ENCOUNTER — Other Ambulatory Visit: Payer: Self-pay

## 2021-05-18 ENCOUNTER — Ambulatory Visit (INDEPENDENT_AMBULATORY_CARE_PROVIDER_SITE_OTHER): Payer: Medicare Other | Admitting: Dermatology

## 2021-05-18 DIAGNOSIS — D219 Benign neoplasm of connective and other soft tissue, unspecified: Secondary | ICD-10-CM | POA: Diagnosis not present

## 2021-05-18 DIAGNOSIS — L821 Other seborrheic keratosis: Secondary | ICD-10-CM

## 2021-05-18 DIAGNOSIS — L988 Other specified disorders of the skin and subcutaneous tissue: Secondary | ICD-10-CM

## 2021-05-18 DIAGNOSIS — L578 Other skin changes due to chronic exposure to nonionizing radiation: Secondary | ICD-10-CM | POA: Diagnosis not present

## 2021-05-18 DIAGNOSIS — L820 Inflamed seborrheic keratosis: Secondary | ICD-10-CM | POA: Diagnosis not present

## 2021-05-18 MED ORDER — TRETINOIN 0.025 % EX CREA: TOPICAL_CREAM | CUTANEOUS | 6 refills | Status: AC

## 2021-05-18 NOTE — Patient Instructions (Addendum)
If you have any questions or concerns for your doctor, please call our main line at [phone] and press option 4 to reach your doctor's medical assistant. If no one answers, please leave a voicemail as directed and we will return your call as soon as possible. Messages left after 4 pm will be answered the following business day.   You may also send us a message via MyChart. We typically respond to MyChart messages within 1-2 business days.  For prescription refills, please ask your pharmacy to contact our office. Our fax number is [phone].  If you have an urgent issue when the clinic is closed that cannot wait until the next business day, you can page your doctor at the number below.    Please note that while we do our best to be available for urgent issues outside of office hours, we are not available 24/7.   If you have an urgent issue and are unable to reach us, you may choose to seek medical care at your doctor's office, retail clinic, urgent care center, or emergency room.  If you have a medical emergency, please immediately call 911 or go to the emergency department.  Pager Numbers  - Dr. Kowalski: [phone]  - Dr. Moye: [phone]  - Dr. Stewart: [phone]  In the event of inclement weather, please call our main line at [phone] for an update on the status of any delays or closures.  Dermatology Medication Tips: Please keep the boxes that topical medications come in in order to help keep track of the instructions about where and how to use these. Pharmacies typically print the medication instructions only on the boxes and not directly on the medication tubes.   If your medication is too expensive, please contact our office at [phone] option 4 or send us a message through MyChart.   We are unable to tell what your co-pay for medications will be in advance as this is different depending on your insurance coverage. However, we may be able to find a substitute  medication at lower cost or fill out paperwork to get insurance to cover a needed medication.   If a prior authorization is required to get your medication covered by your insurance company, please allow us 1-2 business days to complete this process.  Drug prices often vary depending on where the prescription is filled and some pharmacies may offer cheaper prices.  The website www.goodrx.com contains coupons for medications through different pharmacies. The prices here do not account for what the cost may be with help from insurance (it may be cheaper with your insurance), but the website can give you the price if you did not use any insurance.  - You can print the associated coupon and take it with your prescription to the pharmacy.  - You may also stop by our office during regular business hours and pick up a GoodRx coupon card.  - If you need your prescription sent electronically to a different pharmacy, notify our office through [facility] MyChart or by phone at [phone] option 4.  Topical retinoid medications like tretinoin/Retin-A, adapalene/Differin, tazarotene/Fabior, and Epiduo/Epiduo Forte can cause dryness and irritation when first started. Only apply a pea-sized amount to the entire affected area. Avoid applying it around the eyes, edges of mouth and creases at the nose. If you experience irritation, use a good moisturizer first and/or apply the medicine less often. If you are doing well with the medicine, you can increase how often you use it until you   are applying every night. Be careful with sun protection while using this medication as it can make you sensitive to the sun. This medicine should not be used by pregnant women.   

## 2021-05-18 NOTE — Progress Notes (Signed)
   Follow-Up Visit   Subjective  Tracy Grimes is a 87 y.o. female who presents for the following: ISK's  (Recheck previously tx lesions on the chest - tx with LN2 ). Patient has other skin lesions scattered on the arms, face, and trunk that are irritated, and she would like treated today.   The following portions of the chart were reviewed this encounter and updated as appropriate:   Tobacco  Allergies  Meds  Problems  Med Hx  Surg Hx  Fam Hx      Review of Systems:  No other skin or systemic complaints except as noted in HPI or Assessment and Plan.  Objective  Well appearing patient in no apparent distress; mood and affect are within normal limits.  A focused examination was performed including the face, arms, hands, and back. Relevant physical exam findings are noted in the Assessment and Plan.  Face Rhytides and volume loss.   Face x 4, chest x 12, back x 20 for a total of 36 (36) Erythematous keratotic or waxy stuck-on papule or plaque.   Assessment & Plan  Elastosis of skin Face Start Retin A 0.025% cream QHS.   Topical retinoid medications like tretinoin/Retin-A, adapalene/Differin, tazarotene/Fabior, and Epiduo/Epiduo Forte can cause dryness and irritation when first started. Only apply a pea-sized amount to the entire affected area. Avoid applying it around the eyes, edges of mouth and creases at the nose. If you experience irritation, use a good moisturizer first and/or apply the medicine less often. If you are doing well with the medicine, you can increase how often you use it until you are applying every night. Be careful with sun protection while using this medication as it can make you sensitive to the sun. This medicine should not be used by pregnant women.   Inflamed seborrheic keratosis Face x 4, chest x 12, back x 20 for a total of 36 Destruction of lesion - Face x 4, chest x 12, back x 20 for a total of 36 Complexity: simple   Destruction method:  cryotherapy   Informed consent: discussed and consent obtained   Timeout:  patient name, date of birth, surgical site, and procedure verified Lesion destroyed using liquid nitrogen: Yes   Region frozen until ice ball extended beyond lesion: Yes   Outcome: patient tolerated procedure well with no complications   Post-procedure details: wound care instructions given    Fibroma - almost completely resolved today Tongue Bite fibroma - Benign-appearing.  Observation.  Call clinic for new or changing lesions.  Recommend daily use of broad spectrum spf 30+ sunscreen to sun-exposed areas.   Actinic Damage - chronic, secondary to cumulative UV radiation exposure/sun exposure over time - diffuse scaly erythematous macules with underlying dyspigmentation - Recommend daily broad spectrum sunscreen SPF 30+ to sun-exposed areas, reapply every 2 hours as needed.  - Recommend staying in the shade or wearing long sleeves, sun glasses (UVA+UVB protection) and wide brim hats (4-inch brim around the entire circumference of the hat). - Call for new or changing lesions.  Seborrheic Keratoses - Stuck-on, waxy, tan-brown papules and/or plaques  - Benign-appearing - Discussed benign etiology and prognosis. - Observe - Call for any changes  Return for ISK follow up in 8-12 weeks.  Luther Redo, CMA, am acting as scribe for Sarina Ser, MD .  Documentation: I have reviewed the above documentation for accuracy and completeness, and I agree with the above.  Sarina Ser, MD

## 2021-05-25 ENCOUNTER — Encounter: Payer: Self-pay | Admitting: Dermatology

## 2021-08-04 ENCOUNTER — Other Ambulatory Visit: Payer: Self-pay | Admitting: Internal Medicine

## 2021-08-04 DIAGNOSIS — R5383 Other fatigue: Secondary | ICD-10-CM

## 2021-08-04 DIAGNOSIS — Z853 Personal history of malignant neoplasm of breast: Secondary | ICD-10-CM

## 2021-08-04 DIAGNOSIS — R140 Abdominal distension (gaseous): Secondary | ICD-10-CM

## 2021-08-04 DIAGNOSIS — R1084 Generalized abdominal pain: Secondary | ICD-10-CM

## 2021-08-10 ENCOUNTER — Ambulatory Visit
Admission: RE | Admit: 2021-08-10 | Discharge: 2021-08-10 | Disposition: A | Discharge status: Home / Self Care | Payer: Medicare Other | Source: Ambulatory Visit | Attending: Internal Medicine | Admitting: Internal Medicine

## 2021-08-10 ENCOUNTER — Other Ambulatory Visit: Payer: Self-pay

## 2021-08-10 DIAGNOSIS — R5383 Other fatigue: Secondary | ICD-10-CM | POA: Insufficient documentation

## 2021-08-10 DIAGNOSIS — Z853 Personal history of malignant neoplasm of breast: Secondary | ICD-10-CM | POA: Diagnosis present

## 2021-08-10 DIAGNOSIS — R140 Abdominal distension (gaseous): Secondary | ICD-10-CM | POA: Diagnosis not present

## 2021-08-10 DIAGNOSIS — R1084 Generalized abdominal pain: Secondary | ICD-10-CM | POA: Diagnosis present

## 2021-08-10 LAB — POCT I-STAT CREATININE: Creatinine, Ser: 0.5 mg/dL (ref 0.44–1.00)

## 2021-08-10 MED ORDER — IOHEXOL 300 MG/ML  SOLN
80.0000 mL | Freq: Once | INTRAMUSCULAR | Status: AC | PRN
  Administered 2021-08-10: 80 mL via INTRAVENOUS

## 2021-08-19 ENCOUNTER — Other Ambulatory Visit: Payer: Self-pay | Admitting: Internal Medicine

## 2021-08-19 DIAGNOSIS — R1013 Epigastric pain: Secondary | ICD-10-CM

## 2021-08-25 ENCOUNTER — Ambulatory Visit: Payer: Medicare Other

## 2021-09-06 ENCOUNTER — Ambulatory Visit
Admission: RE | Admit: 2021-09-06 | Discharge: 2021-09-06 | Disposition: A | Discharge status: Home / Self Care | Payer: Medicare Other | Source: Ambulatory Visit | Attending: Internal Medicine | Admitting: Internal Medicine

## 2021-09-06 ENCOUNTER — Other Ambulatory Visit: Payer: Self-pay

## 2021-09-06 DIAGNOSIS — R1013 Epigastric pain: Secondary | ICD-10-CM

## 2022-11-14 ENCOUNTER — Other Ambulatory Visit: Payer: Self-pay | Admitting: Physical Medicine & Rehabilitation

## 2022-11-14 DIAGNOSIS — G8929 Other chronic pain: Secondary | ICD-10-CM

## 2022-11-18 ENCOUNTER — Ambulatory Visit
Admission: RE | Admit: 2022-11-18 | Discharge: 2022-11-18 | Disposition: A | Discharge status: Home / Self Care | Payer: Medicare Other | Source: Ambulatory Visit | Attending: Physical Medicine & Rehabilitation | Admitting: Physical Medicine & Rehabilitation

## 2022-11-18 DIAGNOSIS — M5442 Lumbago with sciatica, left side: Secondary | ICD-10-CM | POA: Insufficient documentation

## 2022-11-18 DIAGNOSIS — G8929 Other chronic pain: Secondary | ICD-10-CM | POA: Diagnosis present

## 2023-03-06 DIAGNOSIS — C4492 Squamous cell carcinoma of skin, unspecified: Secondary | ICD-10-CM

## 2023-03-06 HISTORY — DX: Squamous cell carcinoma of skin, unspecified: C44.92

## 2023-05-25 ENCOUNTER — Ambulatory Visit: Payer: Medicare Other | Admitting: Dermatology

## 2023-07-20 ENCOUNTER — Ambulatory Visit: Payer: Medicare Other | Admitting: Dermatology

## 2023-07-25 ENCOUNTER — Encounter: Payer: Self-pay | Admitting: Dermatology

## 2023-07-25 ENCOUNTER — Ambulatory Visit: Payer: Medicare Other | Admitting: Dermatology

## 2023-07-25 DIAGNOSIS — D1801 Hemangioma of skin and subcutaneous tissue: Secondary | ICD-10-CM

## 2023-07-25 DIAGNOSIS — L821 Other seborrheic keratosis: Secondary | ICD-10-CM

## 2023-07-25 DIAGNOSIS — L814 Other melanin hyperpigmentation: Secondary | ICD-10-CM

## 2023-07-25 DIAGNOSIS — L649 Androgenic alopecia, unspecified: Secondary | ICD-10-CM

## 2023-07-25 DIAGNOSIS — W908XXA Exposure to other nonionizing radiation, initial encounter: Secondary | ICD-10-CM

## 2023-07-25 DIAGNOSIS — Z1283 Encounter for screening for malignant neoplasm of skin: Secondary | ICD-10-CM

## 2023-07-25 DIAGNOSIS — Z85828 Personal history of other malignant neoplasm of skin: Secondary | ICD-10-CM

## 2023-07-25 DIAGNOSIS — L578 Other skin changes due to chronic exposure to nonionizing radiation: Secondary | ICD-10-CM

## 2023-07-25 DIAGNOSIS — D229 Melanocytic nevi, unspecified: Secondary | ICD-10-CM

## 2023-07-25 NOTE — Patient Instructions (Addendum)
Recommend minoxidil 5% (Rogaine for men) solution or foam to be applied to the scalp and left in. This should ideally be used twice daily for best results but it helps with hair regrowth when used at least three times per week. Rogaine initially can cause increased hair shedding for the first few weeks but this will stop with continued use. In studies, people who used minoxidil (Rogaine) for at least 6 months had thicker hair than people who did not. Minoxidil topical (Rogaine) only works as long as it continues to be used. If if it is no longer used then the hair it has been helping to regrow can fall out. Minoxidil topical (Rogaine) can cause increased facial hair growth which can usually be managed easily with a battery-operated hair trimmer. If facial hair growth is bothersome, switching to the 2% women's version can decrease the risk of unwanted facial hair growth.   Melanoma ABCDEs  Melanoma is the most dangerous type of skin cancer, and is the leading cause of death from skin disease.  You are more likely to develop melanoma if you: Have light-colored skin, light-colored eyes, or red or blond hair Spend a lot of time in the sun Tan regularly, either outdoors or in a tanning bed Have had blistering sunburns, especially during childhood Have a close family member who has had a melanoma Have atypical moles or large birthmarks  Early detection of melanoma is key since treatment is typically straightforward and cure rates are extremely high if we catch it early.   The first sign of melanoma is often a change in a mole or a new dark spot.  The ABCDE system is a way of remembering the signs of melanoma.  A for asymmetry:  The two halves do not match. B for border:  The edges of the growth are irregular. C for color:  A mixture of colors are present instead of an even brown color. D for diameter:  Melanomas are usually (but not always) greater than 6mm - the size of a pencil eraser. E for  evolution:  The spot keeps changing in size, shape, and color.  Please check your skin once per month between visits. You can use a small mirror in front and a large mirror behind you to keep an eye on the back side or your body.   If you see any new or changing lesions before your next follow-up, please call to schedule a visit.  Please continue daily skin protection including broad spectrum sunscreen SPF 30+ to sun-exposed areas, reapplying every 2 hours as needed when you're outdoors.    Due to recent changes in healthcare laws, you may see results of your pathology and/or laboratory studies on MyChart before the doctors have had a chance to review them. We understand that in some cases there may be results that are confusing or concerning to you. Please understand that not all results are received at the same time and often the doctors may need to interpret multiple results in order to provide you with the best plan of care or course of treatment. Therefore, we ask that you please give [place] 2 business days to thoroughly review all your results before contacting the office for clarification. Should we see a critical lab result, you will be contacted sooner.   If You Need Anything After Your Visit  If you have any questions or concerns for your doctor, please call our main line at [phone] and press option 4 to reach your doctor's  medical assistant. If no one answers, please leave a voicemail as directed and we will return your call as soon as possible. Messages left after 4 pm will be answered the following business day.   You may also send [place] a message via MyChart. We typically respond to MyChart messages within 1-2 business days.  For prescription refills, please ask your pharmacy to contact our office. Our fax number is [phone].  If you have an urgent issue when the clinic is closed that cannot wait until the next business day, you can page your doctor at the number below.    Please  note that while we do our best to be available for urgent issues outside of office hours, we are not available 24/7.   If you have an urgent issue and are unable to reach [place], you may choose to seek medical care at your doctor's office, retail clinic, urgent care center, or emergency room.  If you have a medical emergency, please immediately call 911 or go to the emergency department.  Pager Numbers  - Dr. Gwen Pounds: [phone]  - Dr. Roseanne Reno: [phone]  - Dr. Katrinka Blazing: [phone]   In the event of inclement weather, please call our main line at [phone] for an update on the status of any delays or closures.  Dermatology Medication Tips: Please keep the boxes that topical medications come in in order to help keep track of the instructions about where and how to use these. Pharmacies typically print the medication instructions only on the boxes and not directly on the medication tubes.   If your medication is too expensive, please contact our office at [phone] option 4 or send [place] a message through MyChart.   We are unable to tell what your co-pay for medications will be in advance as this is different depending on your insurance coverage. However, we may be able to find a substitute medication at lower cost or fill out paperwork to get insurance to cover a needed medication.   If a prior authorization is required to get your medication covered by your insurance company, please allow [place] 1-2 business days to complete this process.  Drug prices often vary depending on where the prescription is filled and some pharmacies may offer cheaper prices.  The website www.goodrx.com contains coupons for medications through different pharmacies. The prices here do not account for what the cost may be with help from insurance (it may be cheaper with your insurance), but the website can give you the price if you did not use any insurance.  - You can print the associated coupon and take it with  your prescription to the pharmacy.  - You may also stop by our office during regular business hours and pick up a GoodRx coupon card.  - If you need your prescription sent electronically to a different pharmacy, notify our office through [facility] or by phone at [phone] option 4.     Si Usted Necesita Algo Despus de Su Visita  Tambin puede enviarnos un mensaje a travs de Clinical cytogeneticist. Por lo general respondemos a los mensajes de MyChart en el transcurso de 1 a 2 das hbiles.  Para renovar recetas, por favor pida a su farmacia que se ponga en contacto con nuestra oficina. Annie Sable de fax es [place] [phone].  Si tiene un asunto urgente cuando la clnica est cerrada y que no puede esperar hasta el siguiente da hbil, puede llamar/localizar a su doctor(a) al nmero que aparece a continuacin.  Por favor, tenga en cuenta que aunque hacemos todo lo posible para estar disponibles para asuntos urgentes fuera del horario de [place], no estamos disponibles las 24 horas del da, los 7 [address]  [address] de la [place].   Si tiene un problema urgente y no puede comunicarse con nosotros, puede optar por buscar atencin mdica  en el consultorio de su doctor(a), en una clnica privada, en un centro de atencin urgente o en una sala de emergencias.  Si tiene Engineer, drilling, por favor llame inmediatamente al 911 o vaya a la sala de emergencias.  Nmeros de bper  - Dr. Gwen Pounds: [phone]  - Dra. Roseanne Reno: [phone]  - Dr. Katrinka Blazing: [phone]   En caso de inclemencias del tiempo, por favor llame a Lacy Duverney principal al [phone] para una actualizacin sobre el [place] de cualquier retraso o cierre.  Consejos para la medicacin en dermatologa: Por favor, guarde las cajas en las que vienen los medicamentos de uso tpico para ayudarle a seguir las instrucciones sobre dnde y cmo usarlos. Las farmacias generalmente imprimen las instrucciones del medicamento slo en las cajas y  no directamente en los tubos del [place].   Si su medicamento es muy caro, por favor, pngase en contacto con Rolm Gala llamando al [phone] y presione la opcin 4 o envenos un mensaje a travs de Clinical cytogeneticist.   No podemos decirle cul ser su copago por los medicamentos por adelantado ya que esto es diferente dependiendo de la cobertura de su seguro. Sin embargo, es posible que podamos encontrar un medicamento sustituto a Audiological scientist un formulario para que el seguro cubra el medicamento que se considera necesario.   Si se requiere una autorizacin previa para que su compaa de seguros [place] su medicamento, por favor permtanos de 1 a 2 das hbiles para completar [address]  Los precios de los medicamentos varan con frecuencia dependiendo del Environmental consultant de dnde se surte la receta y alguna farmacias pueden ofrecer precios ms baratos.  El sitio web www.goodrx.com tiene cupones para medicamentos de Health and safety inspector. Los precios aqu no tienen en cuenta lo que podra costar con la ayuda del seguro (puede ser ms barato con su seguro), pero el sitio web puede darle el precio si no utiliz Tourist information centre manager.  - Puede imprimir el cupn correspondiente y llevarlo con su receta a la farmacia.  - Tambin puede pasar por nuestra oficina durante el horario de atencin regular y Education officer, museum una tarjeta de cupones de GoodRx.  - Si necesita que su receta se enve electrnicamente a una farmacia diferente, informe a nuestra oficina a travs de MyChart de [place] o por telfono llamando al [phone] y presione la opcin 4.

## 2023-07-25 NOTE — Progress Notes (Addendum)
Follow-Up Visit   Subjective  Tracy Grimes is a 89 y.o. female who presents for the following: Skin Cancer Screening and Full Body Skin Exam  The patient presents for Total-Body Skin Exam (TBSE) for skin cancer screening and mole check. The patient has spots, moles and lesions to be evaluated, some may be new or changing and the patient may have concern these could be cancer.  Patient with hx of SCC and BCC. Areas at neck and chest that are rough and bumpy. Two spots behind knee. Patient c/o gradual thinning at post scalp over the last year, no itching. No recent illnesses, no new medical changes, no changes in diet.            The following portions of the chart were reviewed this encounter and updated as appropriate: medications, allergies, medical history  Review of Systems:  No other skin or systemic complaints except as noted in HPI or Assessment and Plan.  Objective  Well appearing patient in no apparent distress; mood and affect are within normal limits.  A full examination was performed including scalp, head, eyes, ears, nose, lips, neck, chest, axillae, abdomen, back, buttocks, bilateral upper extremities, bilateral lower extremities, hands, feet, fingers, toes, fingernails, and toenails. All findings within normal limits unless otherwise noted below.   Relevant physical exam findings are noted in the Assessment and Plan. Exam of face limited by presence of make-up. Exam of nails limited by presence of nail polish.   Assessment & Plan   SKIN CANCER SCREENING PERFORMED TODAY.  ACTINIC DAMAGE - Chronic condition, secondary to cumulative UV/sun exposure - diffuse scaly erythematous macules with underlying dyspigmentation - Recommend daily broad spectrum sunscreen SPF 30+ to sun-exposed areas, reapply every 2 hours as needed.  - Staying in the shade or wearing long sleeves, sun glasses (UVA+UVB protection) and wide brim hats (4-inch brim around the entire circumference of  the hat) are also recommended for sun protection.  - Call for new or changing lesions.  LENTIGINES, SEBORRHEIC KERATOSES, HEMANGIOMAS - Benign normal skin lesions - Benign-appearing (on neck, lesions of concern) - Call for any changes  MELANOCYTIC NEVI - Tan-brown and/or pink-flesh-colored symmetric macules and papules - Benign appearing on exam today - Observation - Call clinic for new or changing moles - Recommend daily use of broad spectrum spf 30+ sunscreen to sun-exposed areas.   ANDROGENETIC ALOPECIA (FEMALE PATTERN HAIR LOSS) Exam: Diffuse thinning of the posterior parietal and superior occipital scalp  Chronic and persistent condition with duration or expected duration over one year. Condition is symptomatic/ bothersome to patient. Not currently at goal.         Female Androgenic Alopecia is a chronic condition related to genetics and/or hormonal changes.  In women androgenetic alopecia is commonly associated with menopause but may occur any time after puberty.  It causes hair thinning primarily on the crown with widening of the part and temporal hairline recession.  Can use OTC Rogaine (minoxidil) 5% solution/foam as directed.    Treatment Plan: Recommend minoxidil 5% (Rogaine for men) solution or foam to be applied to the scalp and left in. This should ideally be used twice daily for best results but it helps with hair regrowth when used at least three times per week. Rogaine initially can cause increased hair shedding for the first few weeks but this will stop with continued use. In studies, people who used minoxidil (Rogaine) for at least 6 months had thicker hair than people who did not. Minoxidil  topical (Rogaine) only works as long as it continues to be used. If if it is no longer used then the hair it has been helping to regrow can fall out. Minoxidil topical (Rogaine) can cause increased facial hair growth which can usually be managed easily with a battery-operated hair  trimmer. If facial hair growth is bothersome, switching to the 2% women's version can decrease the risk of unwanted facial hair growth.   HISTORY OF BASAL CELL CARCINOMA OF THE SKIN - No evidence of recurrence today - Recommend regular full body skin exams - Recommend daily broad spectrum sunscreen SPF 30+ to sun-exposed areas, reapply every 2 hours as needed.  - Call if any new or changing lesions are noted between office visits  HISTORY OF SQUAMOUS CELL CARCINOMA OF THE SKIN - No evidence of recurrence today - No lymphadenopathy - Recommend regular full body skin exams - Recommend daily broad spectrum sunscreen SPF 30+ to sun-exposed areas, reapply every 2 hours as needed.  - Call if any new or changing lesions are noted between office visits    Return in about 1 year (around 07/24/2024) for TBSE, Hx SCC, Hx BCC.  Anise Salvo, RMA, am acting as scribe for Elie Goody, MD .   Documentation: I have reviewed the above documentation for accuracy and completeness, and I agree with the above.  Elie Goody, MD

## 2024-01-11 ENCOUNTER — Other Ambulatory Visit: Payer: Self-pay | Admitting: Internal Medicine

## 2024-01-11 DIAGNOSIS — R519 Headache, unspecified: Secondary | ICD-10-CM

## 2024-01-11 DIAGNOSIS — R531 Weakness: Secondary | ICD-10-CM

## 2024-01-16 ENCOUNTER — Other Ambulatory Visit: Payer: Medicare Other

## 2024-01-23 ENCOUNTER — Ambulatory Visit
Admission: RE | Admit: 2024-01-23 | Discharge: 2024-01-23 | Disposition: A | Discharge status: Home / Self Care | Payer: Medicare Other | Source: Ambulatory Visit | Attending: Internal Medicine | Admitting: Internal Medicine

## 2024-01-23 DIAGNOSIS — R519 Headache, unspecified: Secondary | ICD-10-CM | POA: Insufficient documentation

## 2024-01-23 DIAGNOSIS — R531 Weakness: Secondary | ICD-10-CM | POA: Diagnosis present

## 2024-01-24 ENCOUNTER — Ambulatory Visit: Payer: Medicare Other | Admitting: Dermatology

## 2024-06-26 ENCOUNTER — Other Ambulatory Visit: Payer: Self-pay | Admitting: Neurology

## 2024-06-26 DIAGNOSIS — R413 Other amnesia: Secondary | ICD-10-CM

## 2024-07-13 ENCOUNTER — Other Ambulatory Visit

## 2024-07-15 ENCOUNTER — Ambulatory Visit
Admission: RE | Admit: 2024-07-15 | Discharge: 2024-07-15 | Disposition: A | Discharge status: Home / Self Care | Source: Ambulatory Visit | Attending: Neurology | Admitting: Neurology

## 2024-07-15 DIAGNOSIS — R413 Other amnesia: Secondary | ICD-10-CM | POA: Diagnosis present

## 2024-07-25 ENCOUNTER — Encounter: Payer: Medicare Other | Admitting: Dermatology

## 2024-08-27 ENCOUNTER — Encounter: Payer: Self-pay | Admitting: Dermatology

## 2024-08-27 ENCOUNTER — Ambulatory Visit (INDEPENDENT_AMBULATORY_CARE_PROVIDER_SITE_OTHER): Payer: Medicare Other | Admitting: Dermatology

## 2024-08-27 DIAGNOSIS — Z8589 Personal history of malignant neoplasm of other organs and systems: Secondary | ICD-10-CM

## 2024-08-27 DIAGNOSIS — L578 Other skin changes due to chronic exposure to nonionizing radiation: Secondary | ICD-10-CM | POA: Diagnosis not present

## 2024-08-27 DIAGNOSIS — L814 Other melanin hyperpigmentation: Secondary | ICD-10-CM

## 2024-08-27 DIAGNOSIS — D229 Melanocytic nevi, unspecified: Secondary | ICD-10-CM

## 2024-08-27 DIAGNOSIS — W908XXA Exposure to other nonionizing radiation, initial encounter: Secondary | ICD-10-CM | POA: Diagnosis not present

## 2024-08-27 DIAGNOSIS — Z1283 Encounter for screening for malignant neoplasm of skin: Secondary | ICD-10-CM

## 2024-08-27 DIAGNOSIS — L820 Inflamed seborrheic keratosis: Secondary | ICD-10-CM | POA: Diagnosis not present

## 2024-08-27 DIAGNOSIS — D1801 Hemangioma of skin and subcutaneous tissue: Secondary | ICD-10-CM

## 2024-08-27 DIAGNOSIS — Z853 Personal history of malignant neoplasm of breast: Secondary | ICD-10-CM

## 2024-08-27 DIAGNOSIS — Z85828 Personal history of other malignant neoplasm of skin: Secondary | ICD-10-CM

## 2024-08-27 NOTE — Patient Instructions (Addendum)

## 2024-08-27 NOTE — Progress Notes (Deleted)
 l

## 2024-08-27 NOTE — Progress Notes (Signed)
 Follow-Up Visit   Subjective  Tracy Grimes is a 89 y.o. female who presents for the following: Skin Cancer Screening and Full Body Skin Exam, hx of BCC, hx of SCC  The patient presents for Total-Body Skin Exam (TBSE) for skin cancer screening and mole check. The patient has spots, moles and lesions to be evaluated, some may be new or changing and the patient may have concern these could be cancer.  The following portions of the chart were reviewed this encounter and updated as appropriate: medications, allergies, medical history  Review of Systems:  No other skin or systemic complaints except as noted in HPI or Assessment and Plan.  Objective  Well appearing patient in no apparent distress; mood and affect are within normal limits.  A full examination was performed including scalp, head, eyes, ears, nose, lips, neck, chest, axillae, abdomen, back, buttocks, bilateral upper extremities, bilateral lower extremities, hands, feet, fingers, toes, fingernails, and toenails. All findings within normal limits unless otherwise noted below.   Relevant physical exam findings are noted in the Assessment and Plan.  left pretibial x 1, left cheek x 1, left ear tragus x 1 (3) Stuck-on, waxy, tan-brown papules and plaques -- Discussed benign etiology and prognosis.   Assessment & Plan   SKIN CANCER SCREENING PERFORMED TODAY.  ACTINIC DAMAGE - Chronic condition, secondary to cumulative UV/sun exposure - diffuse scaly erythematous macules with underlying dyspigmentation - Recommend daily broad spectrum sunscreen SPF 30+ to sun-exposed areas, reapply every 2 hours as needed.  - Staying in the shade or wearing long sleeves, sun glasses (UVA+UVB protection) and wide brim hats (4-inch brim around the entire circumference of the hat) are also recommended for sun protection.  - Call for new or changing lesions.  LENTIGINES, SEBORRHEIC KERATOSES, HEMANGIOMAS - Benign normal skin lesions -  Benign-appearing - Call for any changes  MELANOCYTIC NEVI - Tan-brown and/or pink-flesh-colored symmetric macules and papules - Benign appearing on exam today - Observation - Call clinic for new or changing moles - Recommend daily use of broad spectrum spf 30+ sunscreen to sun-exposed areas.   HISTORY OF BASAL CELL CARCINOMA OF THE SKIN - No evidence of recurrence today - Recommend regular full body skin exams - Recommend daily broad spectrum sunscreen SPF 30+ to sun-exposed areas, reapply every 2 hours as needed.  - Call if any new or changing lesions are noted between office visits   HISTORY OF SQUAMOUS CELL CARCINOMA OF THE SKIN - No evidence of recurrence today - No lymphadenopathy - Recommend regular full body skin exams - Recommend daily broad spectrum sunscreen SPF 30+ to sun-exposed areas, reapply every 2 hours as needed.  - Call if any new or changing lesions are noted between office visits  Bilateral mastectomy  Hx of breast cancer  No lymphadenopathy   INFLAMED SEBORRHEIC KERATOSIS (3) left pretibial x 1, left cheek x 1, left ear tragus x 1 (3) Symptomatic, irritating, patient would like treated.  Destruction of lesion - left pretibial x 1, left cheek x 1, left ear tragus x 1 (3) Complexity: simple   Destruction method: cryotherapy   Informed consent: discussed and consent obtained   Timeout:  patient name, date of birth, surgical site, and procedure verified Lesion destroyed using liquid nitrogen: Yes   Region frozen until ice ball extended beyond lesion: Yes   Outcome: patient tolerated procedure well with no complications   Post-procedure details: wound care instructions given      Return in about 1  year (around 08/27/2025) for TBSE, hx of BCC, hx of SCC.  IFay Kirks, CMA, am acting as scribe for Alm Rhyme, MD .   Documentation: I have reviewed the above documentation for accuracy and completeness, and I agree with the above.  Alm Rhyme,  MD

## 2025-08-26 ENCOUNTER — Ambulatory Visit: Admitting: Dermatology
